# Patient Record
Sex: Male | Born: 1965 | Race: Black or African American | Hispanic: No | Marital: Single
Health system: Southern US, Community
[De-identification: ages and names within clinical notes are randomized; demographics above are authoritative.]

## PROBLEM LIST (undated history)

## (undated) ENCOUNTER — Emergency Department (HOSPITAL_COMMUNITY): Admission: EM

---

## 1998-12-21 ENCOUNTER — Emergency Department (HOSPITAL_COMMUNITY): Admission: EM | Admit: 1998-12-21 | Discharge: 1998-12-21 | Payer: Self-pay | Admitting: Emergency Medicine

## 1998-12-22 ENCOUNTER — Emergency Department (HOSPITAL_COMMUNITY): Admission: EM | Admit: 1998-12-22 | Discharge: 1998-12-22 | Payer: Self-pay | Admitting: Emergency Medicine

## 1998-12-22 ENCOUNTER — Encounter: Payer: Self-pay | Admitting: Emergency Medicine

## 2000-11-23 ENCOUNTER — Emergency Department (HOSPITAL_COMMUNITY): Admission: EM | Admit: 2000-11-23 | Discharge: 2000-11-23 | Payer: Self-pay | Admitting: Emergency Medicine

## 2001-12-07 ENCOUNTER — Inpatient Hospital Stay (HOSPITAL_COMMUNITY): Admission: EM | Admit: 2001-12-07 | Discharge: 2001-12-08 | Payer: Self-pay

## 2001-12-07 ENCOUNTER — Encounter: Payer: Self-pay | Admitting: Internal Medicine

## 2002-03-07 ENCOUNTER — Emergency Department (HOSPITAL_COMMUNITY): Admission: EM | Admit: 2002-03-07 | Discharge: 2002-03-07 | Payer: Self-pay | Admitting: Emergency Medicine

## 2015-06-17 ENCOUNTER — Emergency Department (HOSPITAL_COMMUNITY)
Admission: EM | Admit: 2015-06-17 | Discharge: 2015-06-17 | Disposition: A | Discharge status: Home / Self Care | Payer: Self-pay | Attending: Emergency Medicine | Admitting: Emergency Medicine

## 2015-06-17 ENCOUNTER — Encounter (HOSPITAL_COMMUNITY): Payer: Self-pay | Admitting: Emergency Medicine

## 2015-06-17 ENCOUNTER — Emergency Department (HOSPITAL_COMMUNITY): Payer: Self-pay

## 2015-06-17 DIAGNOSIS — M791 Myalgia: Secondary | ICD-10-CM | POA: Insufficient documentation

## 2015-06-17 DIAGNOSIS — F17200 Nicotine dependence, unspecified, uncomplicated: Secondary | ICD-10-CM | POA: Insufficient documentation

## 2015-06-17 DIAGNOSIS — R0789 Other chest pain: Secondary | ICD-10-CM | POA: Insufficient documentation

## 2015-06-17 DIAGNOSIS — R509 Fever, unspecified: Secondary | ICD-10-CM | POA: Insufficient documentation

## 2015-06-17 DIAGNOSIS — J3489 Other specified disorders of nose and nasal sinuses: Secondary | ICD-10-CM | POA: Insufficient documentation

## 2015-06-17 LAB — CBC WITH DIFFERENTIAL/PLATELET
BASOS ABS: 0 10*3/uL (ref 0.0–0.1)
BASOS PCT: 0 %
Eosinophils Absolute: 0 10*3/uL (ref 0.0–0.7)
Eosinophils Relative: 0 %
HEMATOCRIT: 41.5 % (ref 39.0–52.0)
HEMOGLOBIN: 14.2 g/dL (ref 13.0–17.0)
Lymphocytes Relative: 15 %
Lymphs Abs: 1.8 10*3/uL (ref 0.7–4.0)
MCH: 29.6 pg (ref 26.0–34.0)
MCHC: 34.2 g/dL (ref 30.0–36.0)
MCV: 86.6 fL (ref 78.0–100.0)
Monocytes Absolute: 1.1 10*3/uL — ABNORMAL HIGH (ref 0.1–1.0)
Monocytes Relative: 9 %
NEUTROS ABS: 8.8 10*3/uL — AB (ref 1.7–7.7)
NEUTROS PCT: 76 %
Platelets: 187 10*3/uL (ref 150–400)
RBC: 4.79 MIL/uL (ref 4.22–5.81)
RDW: 14.2 % (ref 11.5–15.5)
WBC: 11.7 10*3/uL — AB (ref 4.0–10.5)

## 2015-06-17 LAB — COMPREHENSIVE METABOLIC PANEL
ALK PHOS: 49 U/L (ref 38–126)
ALT: 15 U/L — ABNORMAL LOW (ref 17–63)
ANION GAP: 8 (ref 5–15)
AST: 13 U/L — ABNORMAL LOW (ref 15–41)
Albumin: 3.2 g/dL — ABNORMAL LOW (ref 3.5–5.0)
BILIRUBIN TOTAL: 0.5 mg/dL (ref 0.3–1.2)
BUN: 9 mg/dL (ref 6–20)
CALCIUM: 8.9 mg/dL (ref 8.9–10.3)
CO2: 23 mmol/L (ref 22–32)
Chloride: 102 mmol/L (ref 101–111)
Creatinine, Ser: 1.38 mg/dL — ABNORMAL HIGH (ref 0.61–1.24)
GFR calc non Af Amer: 60 mL/min — ABNORMAL LOW (ref >60)
Glucose, Bld: 98 mg/dL (ref 65–99)
Potassium: 3.7 mmol/L (ref 3.5–5.1)
SODIUM: 133 mmol/L — AB (ref 135–145)
TOTAL PROTEIN: 6.7 g/dL (ref 6.5–8.1)

## 2015-06-17 LAB — INFLUENZA PANEL BY PCR (TYPE A & B)
H1N1FLUPCR: NOT DETECTED
INFLBPCR: NEGATIVE
Influenza A By PCR: NEGATIVE

## 2015-06-17 MED ORDER — ACETAMINOPHEN 325 MG PO TABS
650.0000 mg | ORAL_TABLET | Freq: Once | ORAL | Status: AC
  Administered 2015-06-17: 650 mg via ORAL
  Filled 2015-06-17: qty 2

## 2015-06-17 MED ORDER — SODIUM CHLORIDE 0.9 % IV BOLUS (SEPSIS)
1000.0000 mL | Freq: Once | INTRAVENOUS | Status: AC
  Administered 2015-06-17: 1000 mL via INTRAVENOUS

## 2015-06-17 NOTE — ED Notes (Signed)
Pt c/o fever and body aches onset yesterday. Pt reports fever was 104.

## 2015-06-17 NOTE — Discharge Instructions (Signed)
Take Motrin or Tylenol every 6 hours for fevers. Follow up with your Primary care doctor in the next couple of days for re-evaluation. Return to the emergency department if symptoms worsen.   Fever, Adult A fever is an increase in the body's temperature. It is often defined as a temperature of 100 F (38C) or higher. Short mild or moderate fevers often have no long-term effects. They also often do not need treatment. Moderate or high fevers may make you feel uncomfortable. Sometimes, they can also be a sign of a serious illness or disease. The sweating that may happen with repeated fevers or fevers that last a while may also cause you to not have enough fluid in your body (dehydration). You can take your temperature with a thermometer to see if you have a fever. A measured temperature can change with:  Age.  Time of day.  Where the thermometer is placed:  Mouth (oral).  Rectum (rectal).  Ear (tympanic).  Underarm (axillary).  Forehead (temporal). HOME CARE Pay attention to any changes in your symptoms. Take these actions to help with your condition:  Take over-the-counter and prescription medicines only as told by your doctor. Follow the dosing instructions carefully.  If you were prescribed an antibiotic medicine, take it as told by your doctor. Do not stop taking the antibiotic even if you start to feel better.  Rest as needed.  Drink enough fluid to keep your pee (urine) clear or pale yellow.  Sponge yourself or bathe with room-temperature water as needed. This helps to lower your body temperature . Do not use ice water.  Do not wear too many blankets or heavy clothes. GET HELP IF:  You throw up (vomit).  You cannot eat or drink without throwing up.  You have watery poop (diarrhea).  It hurts when you pee.  Your symptoms do not get better with treatment.  You have new symptoms.  You feel very weak. GET HELP RIGHT AWAY IF:  You are short of breath or have  trouble breathing.  You are dizzy or you pass out (faint).  You feel confused.  You have signs of not having enough fluid in your body, such as:  A dry mouth.  Peeing less.  Looking pale.  You have very bad pain in your belly (abdomen).  You keep throwing up or having water poop.  You have a skin rash.  Your symptoms suddenly get worse.   This information is not intended to replace advice given to you by your health care provider. Make sure you discuss any questions you have with your health care provider.   Document Released: 04/01/2008 Document Revised: 03/14/2015 Document Reviewed: 08/17/2014 Elsevier Interactive Patient Education [employer]2016 Elsevier Inc.

## 2015-06-17 NOTE — ED Notes (Signed)
Family at bedside. 

## 2015-06-17 NOTE — ED Provider Notes (Signed)
CSN: [id number]     Arrival date & time 06/17/15  1734 History   First MD Initiated Contact with Patient 06/17/15 1800     Chief Complaint  Patient presents with  . Fever  . Generalized Body Aches     (Consider location/radiation/quality/duration/timing/severity/associated sxs/prior Treatment) HPI  Patient is a 49 year old male with no significant past medical history who presents to the emergency department with fever. Reports a 2 day history of fever, Tmax 100.4. Associated with headache, cough, congestion or shortness of breath, nausea, myalgias, diarrhea. Denies neck pain or stiffness. No rashes. Denies abdominal pain, bloody stool, dysuria, urinary urgency or frequency. Denies taking any medication.   History reviewed. No pertinent past medical history. History reviewed. No pertinent past surgical history. No family history on file. Social History  Substance Use Topics  . Smoking status: Current Every Day Smoker  . Smokeless tobacco: None  . Alcohol Use: Yes    Review of Systems  Constitutional: Negative for fever and appetite change.  HENT: Positive for rhinorrhea. Negative for congestion.   Eyes: Negative for visual disturbance.  Respiratory: Positive for cough and chest tightness. Negative for shortness of breath.   Cardiovascular: Negative for chest pain and palpitations.  Gastrointestinal: Negative for nausea, vomiting, abdominal pain and blood in stool.  Genitourinary: Negative for dysuria, frequency, hematuria and decreased urine volume.  Musculoskeletal: Positive for myalgias. Negative for back pain, neck pain and neck stiffness.  Skin: Negative for rash.  Neurological: Negative for dizziness, seizures, weakness, light-headedness and headaches.  Psychiatric/Behavioral: Negative for behavioral problems.      Allergies  Review of patient's allergies indicates no known allergies.  Home Medications   Prior to Admission medications   Not on File   BP 110/64  mmHg  Pulse 102  Temp(Src) 100.4 F (38 C) (Oral)  Resp 24  Ht 5\' 6"  (1.676 m)  Wt 90.719 kg  BMI 32.30 kg/m2  SpO2 95% Physical Exam  Constitutional: He is oriented to person, place, and time. He appears well-developed and well-nourished. He appears ill. No distress.  HENT:  Head: Normocephalic and atraumatic.  Mouth/Throat: Oropharynx is clear and moist.  Eyes: Conjunctivae and EOM are normal. Pupils are equal, round, and reactive to light.  Neck: Normal range of motion. Neck supple. No JVD present. No tracheal deviation present. No Brudzinski's sign and no Kernig's sign noted.  Cardiovascular: Regular rhythm, normal heart sounds and intact distal pulses.   Pulmonary/Chest: Effort normal and breath sounds normal. No respiratory distress. He has no wheezes. He exhibits no tenderness.  Abdominal: Soft. He exhibits no distension. There is no tenderness. There is no rebound and no guarding.  Musculoskeletal: Normal range of motion.  Neurological: He is alert and oriented to person, place, and time.  Skin: Skin is warm.  Psychiatric: He has a normal mood and affect.  Nursing note and vitals reviewed.   ED Course  Procedures (including critical care time) Labs Review Labs Reviewed  COMPREHENSIVE METABOLIC PANEL - Abnormal; Notable for the following:    Sodium 133 (*)    Creatinine, Ser 1.38 (*)    Albumin 3.2 (*)    AST 13 (*)    ALT 15 (*)    GFR calc non Af Amer 60 (*)    All other components within normal limits  CBC WITH DIFFERENTIAL/PLATELET - Abnormal; Notable for the following:    WBC 11.7 (*)    Neutro Abs 8.8 (*)    Monocytes Absolute 1.1 (*)  All other components within normal limits  INFLUENZA PANEL BY PCR (TYPE A & B, H1N1)    Imaging Review Dg Chest Port 1 View  06/17/2015  CLINICAL DATA:  Cough, fever, and weakness for 2 days.  Smoker. EXAM: PORTABLE CHEST 1 VIEW COMPARISON:  None. FINDINGS: The heart size and mediastinal contours are within normal limits.  Both lungs are clear. The visualized skeletal structures are unremarkable. IMPRESSION: No active disease. Electronically Signed   By: Burman Nieves M.D.   On: 06/17/2015 19:17   I have personally reviewed and evaluated these images and lab results as part of my medical decision-making.   EKG Interpretation None      MDM   Final diagnoses:  Fever and chills    Patient is a 49 year old male with no significant past medical history who presents to the emergency department with a fever. Temp 100.4 on arrival. Hemodynamically stable. Exam as above, notable for lungs clear to auscultation bilaterally, benign abdominal exam, intact distal pulses.  Patient most likely with a viral illness. Found to have leukocytosis 11.7. No significant electrolyte abnormalities. Chest x-ray showed no signs of pneumonia. Influenza test negative. Patient given IV fluids and Tylenol. Do not feel the patient needs further imaging at this time. No signs of serious bacterial source. No signs of meningismus. Reevaluation patient reports that he felt significantly better. Patient stable for discharge home. Discussed follow-up with primary care physician in the next 2-3 days. Discussed strict return precautions to the emergency department. Patient expressed understanding, no questions or concerns at time of discharge.    Corena Herter, MD 06/18/15 1442  Benjiman Core, MD 06/20/15 1640

## 2015-06-17 NOTE — ED Notes (Signed)
Patient is alert and orientedx4.  Patient was explained discharge instructions and they understood them with no questions.   

## 2017-06-09 IMAGING — CR DG CHEST 1V PORT
1 series · 1 of 1 positions shown · non-contrast
Comparison: None.

CLINICAL DATA: Cough, fever, and weakness for 2 days.  Smoker.

EXAM:
PORTABLE CHEST 1 VIEW

[AP]
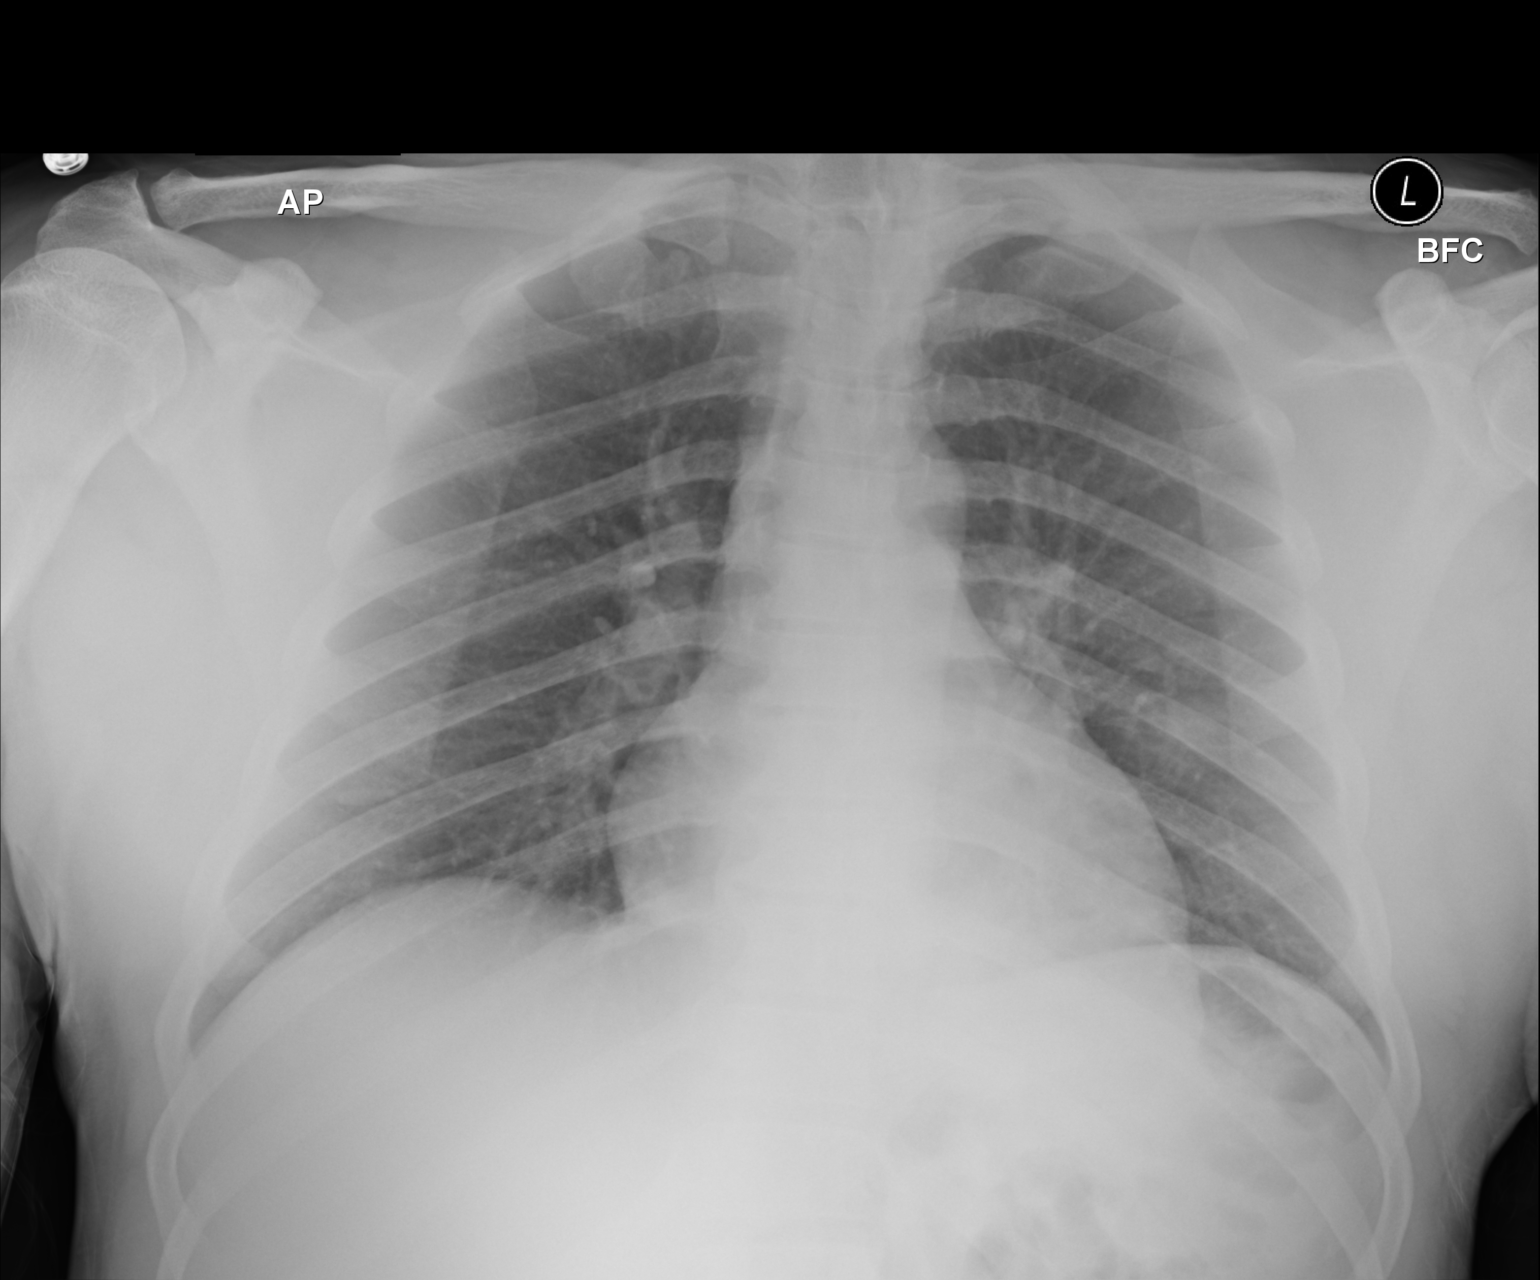

[1 of 1 positions shown; findings below may reference images not displayed]

FINDINGS: The heart size and mediastinal contours are within normal limits.
Both lungs are clear. The visualized skeletal structures are
unremarkable.
IMPRESSION: No active disease.

## 2019-12-15 ENCOUNTER — Other Ambulatory Visit: Payer: Self-pay

## 2019-12-15 ENCOUNTER — Emergency Department (HOSPITAL_COMMUNITY)
Admission: EM | Admit: 2019-12-15 | Discharge: 2019-12-16 | Disposition: A | Discharge status: Home / Self Care | Payer: No Typology Code available for payment source | Attending: Emergency Medicine | Admitting: Emergency Medicine

## 2019-12-15 DIAGNOSIS — Y929 Unspecified place or not applicable: Secondary | ICD-10-CM | POA: Diagnosis not present

## 2019-12-15 DIAGNOSIS — Y999 Unspecified external cause status: Secondary | ICD-10-CM | POA: Insufficient documentation

## 2019-12-15 DIAGNOSIS — Y93G1 Activity, food preparation and clean up: Secondary | ICD-10-CM | POA: Diagnosis not present

## 2019-12-15 DIAGNOSIS — F17210 Nicotine dependence, cigarettes, uncomplicated: Secondary | ICD-10-CM | POA: Diagnosis not present

## 2019-12-15 DIAGNOSIS — W268XXA Contact with other sharp object(s), not elsewhere classified, initial encounter: Secondary | ICD-10-CM | POA: Insufficient documentation

## 2019-12-15 DIAGNOSIS — S61411A Laceration without foreign body of right hand, initial encounter: Secondary | ICD-10-CM | POA: Diagnosis present

## 2019-12-16 ENCOUNTER — Emergency Department (HOSPITAL_COMMUNITY): Payer: No Typology Code available for payment source

## 2019-12-16 ENCOUNTER — Other Ambulatory Visit: Payer: Self-pay

## 2019-12-16 MED ORDER — BACITRACIN ZINC 500 UNIT/GM EX OINT
TOPICAL_OINTMENT | Freq: Once | CUTANEOUS | Status: AC
  Administered 2019-12-16: 1 via TOPICAL

## 2019-12-16 MED ORDER — TETANUS-DIPHTH-ACELL PERTUSSIS 5-2.5-18.5 LF-MCG/0.5 IM SUSP
0.5000 mL | Freq: Once | INTRAMUSCULAR | Status: AC
  Administered 2019-12-16: 0.5 mL via INTRAMUSCULAR
  Filled 2019-12-16: qty 0.5

## 2019-12-16 MED ORDER — LIDOCAINE HCL (PF) 1 % IJ SOLN
5.0000 mL | Freq: Once | INTRAMUSCULAR | Status: AC
  Administered 2019-12-16: 5 mL
  Filled 2019-12-16: qty 5

## 2019-12-16 MED ORDER — CEPHALEXIN 500 MG PO CAPS: 500.0000 mg | ORAL_CAPSULE | Freq: Three times a day (TID) | ORAL | 0 refills | Status: AC

## 2019-12-16 MED ORDER — ACETAMINOPHEN 325 MG PO TABS
650.0000 mg | ORAL_TABLET | Freq: Once | ORAL | Status: AC
  Administered 2019-12-16: 650 mg via ORAL
  Filled 2019-12-16: qty 2

## 2019-12-16 NOTE — ED Notes (Addendum)
Pt left before signing the pad however he received d/c paperwork and understood the instructions.

## 2019-12-16 NOTE — ED Notes (Signed)
Pt hand soaking in betadine and sterile water 

## 2019-12-16 NOTE — ED Provider Notes (Signed)
[facility] EMERGENCY DEPARTMENT Provider Note   CSN: [id number] Arrival date & time: 12/15/19  2352     History Chief Complaint  Patient presents with  . Extremity Laceration    hand    Jason Coleman is a 54 y.o. male.  54 year old right-hand-dominant male presents with laceration to the palm of the right hand.  Patient states that he was opening a can of meat he accidentally sliced the palm of his hand yesterday.  Patient is not anticoagulated, last tetanus unknown.  Denies weakness or numbness in the hand.  Bleeding is controlled.  No other complaints or concerns today.        No past medical history on file.  There are no problems to display for this patient.   No past surgical history on file.     No family history on file.  Social History   Tobacco Use  . Smoking status: Current Every Day Smoker  Substance Use Topics  . Alcohol use: Yes  . Drug use: Yes    Types: Marijuana    Home Medications Prior to Admission medications   Medication Sig Start Date End Date Taking? Authorizing Provider  cephALEXin (KEFLEX) 500 MG capsule Take 1 capsule (500 mg total) by mouth 3 (three) times daily for 5 days. 12/16/19 12/21/19  Jeannie Fend, PA-C    Allergies    Patient has no known allergies.  Review of Systems   Review of Systems  Constitutional: Negative for fever.  Musculoskeletal: Positive for myalgias.  Skin: Positive for wound.  Allergic/Immunologic: Negative for immunocompromised state.  Neurological: Negative for weakness and numbness.  Hematological: Does not bruise/bleed easily.    Physical Exam Updated Vital Signs BP (!) 162/91   Pulse (!) 107   Temp 98.3 F (36.8 C) (Oral)   Resp 18   SpO2 100%   Physical Exam Vitals and nursing note reviewed.  Constitutional:      General: He is not in acute distress.    Appearance: He is well-developed. He is not diaphoretic.  HENT:     Head: Normocephalic and atraumatic.    Cardiovascular:     Pulses: Normal pulses.  Pulmonary:     Effort: Pulmonary effort is normal.  Musculoskeletal:        General: Tenderness present. No swelling. Normal range of motion.       Arms:  Skin:    General: Skin is warm and dry.     Coloration: Skin is not pale.     Findings: No erythema or rash.  Neurological:     Mental Status: He is alert and oriented to person, place, and time.     Sensory: No sensory deficit.  Psychiatric:        Behavior: Behavior normal.     ED Results / Procedures / Treatments   Labs (all labs ordered are listed, but only abnormal results are displayed) Labs Reviewed - No data to display  EKG None  Radiology DG Hand Complete Right  Result Date: 12/16/2019 CLINICAL DATA:  Cut hand on a can, injury to the palmar soft tissues near the first web space. EXAM: RIGHT HAND - COMPLETE 3+ VIEW COMPARISON:  None. FINDINGS: Soft tissue irregularity and swelling along the palmar soft tissues adjacent the first web space without soft tissue gas or foreign body. Overlying bandaging material is present. No acute bony abnormality. Specifically, no fracture, subluxation, or dislocation. There is remote deformity of the fifth proximal phalanx and diaphysis of  the fifth metacarpal. IMPRESSION: 1. Soft tissue injury of the first web space, correlate with site of laceration. No underlying osseous abnormality. No soft tissue gas or foreign body. 2. Remote deformities of the fifth proximal phalanx and fifth metacarpal. Electronically Signed   By: Kreg Shropshire M.D.   On: 12/16/2019 01:24    Procedures .Marland KitchenLaceration Repair  Date/Time: 12/16/2019 7:46 AM Performed by: Jeannie Fend, PA-C Authorized by: Jeannie Fend, PA-C   Consent:    Consent obtained:  Verbal   Consent given by:  Patient   Risks discussed:  Infection, need for additional repair, pain, poor cosmetic result and poor wound healing   Alternatives discussed:  No treatment and delayed  treatment Universal protocol:    Procedure explained and questions answered to patient or proxy's satisfaction: yes     Relevant documents present and verified: yes     Test results available and properly labeled: yes     Imaging studies available: yes     Required blood products, implants, devices, and special equipment available: yes     Site/side marked: yes     Immediately prior to procedure, a time out was called: yes     Patient identity confirmed:  Verbally with patient Anesthesia (see MAR for exact dosages):    Anesthesia method:  Local infiltration   Local anesthetic:  Lidocaine 1% w/o epi Laceration details:    Location:  Hand   Hand location:  R palm   Length (cm):  4   Depth (mm):  3 Repair type:    Repair type:  Simple Pre-procedure details:    Preparation:  Patient was prepped and draped in usual sterile fashion and imaging obtained to evaluate for foreign bodies Exploration:    Hemostasis achieved with:  Direct pressure   Wound exploration: wound explored through full range of motion and entire depth of wound probed and visualized     Wound extent: no foreign bodies/material noted, no muscle damage noted, no nerve damage noted, no tendon damage noted, no underlying fracture noted and no vascular damage noted     Contaminated: no   Treatment:    Area cleansed with:  Saline   Amount of cleaning:  Extensive   Irrigation solution:  Sterile saline Skin repair:    Repair method:  Sutures   Suture size:  4-0   Suture material:  Nylon   Suture technique:  Simple interrupted   Number of sutures:  8 Approximation:    Approximation:  Close Post-procedure details:    Dressing:  Antibiotic ointment and bulky dressing   Patient tolerance of procedure:  Tolerated well, no immediate complications   (including critical care time)  Medications Ordered in ED Medications  Tdap (BOOSTRIX) injection 0.5 mL (0.5 mLs Intramuscular Given 12/16/19 0702)  acetaminophen (TYLENOL)  tablet 650 mg (650 mg Oral Given 12/16/19 0702)  lidocaine (PF) (XYLOCAINE) 1 % injection 5 mL (5 mLs Infiltration Given 12/16/19 0729)  bacitracin ointment (1 application Topical Given 12/16/19 0757)    ED Course  I have reviewed the triage vital signs and the nursing notes.  Pertinent labs & imaging results that were available during my care of the patient were reviewed by me and considered in my medical decision making (see chart for details).  Clinical Course as of Dec 16 850  Fri Dec 16, 2019  0849 53yo male with laceration to the palm of the right hand. Laceration occurred yesterday evening, patient came to the ER after several hours  of not being able to stop the bleeding at home. Prolonged wait in the ER today due to high volume, wound was soaked, cleaned, irrigated, and closed with sutures, will place on antibiotics due to length of time to closure. Recommend wound check in 2 days, suture removal in 7-10 days. Sensation intact, ROM normal against resistance. XR negative for FB.   [LM]    Clinical Course User Index [LM] Roque Lias   MDM Rules/Calculators/A&P                          Final Clinical Impression(s) / ED Diagnoses Final diagnoses:  Laceration of right hand without foreign body, initial encounter    Rx / DC Orders ED Discharge Orders         Ordered    cephALEXin (KEFLEX) 500 MG capsule  3 times daily     Discontinue  Reprint     12/16/19 0747           Tacy Learn, PA-C 12/16/19 8295    Dorie Rank, MD 12/17/19 [phone]

## 2019-12-16 NOTE — ED Notes (Signed)
ED provider at bedside for repair 

## 2019-12-16 NOTE — ED Triage Notes (Signed)
Pt was at home and was opening a can and went to pull the lid and sliced his right hand in the palm area. Pt had bleeding controlled. There is a deep laceration in the palm.

## 2021-12-08 IMAGING — DX DG HAND COMPLETE 3+V*R*
3 series · 3 of 3 positions shown · non-contrast
Comparison: None.

CLINICAL DATA: Cut hand on a can, injury to the palmar soft tissues
near the first web space.

EXAM:
RIGHT HAND - COMPLETE 3+ VIEW

[hand pa]
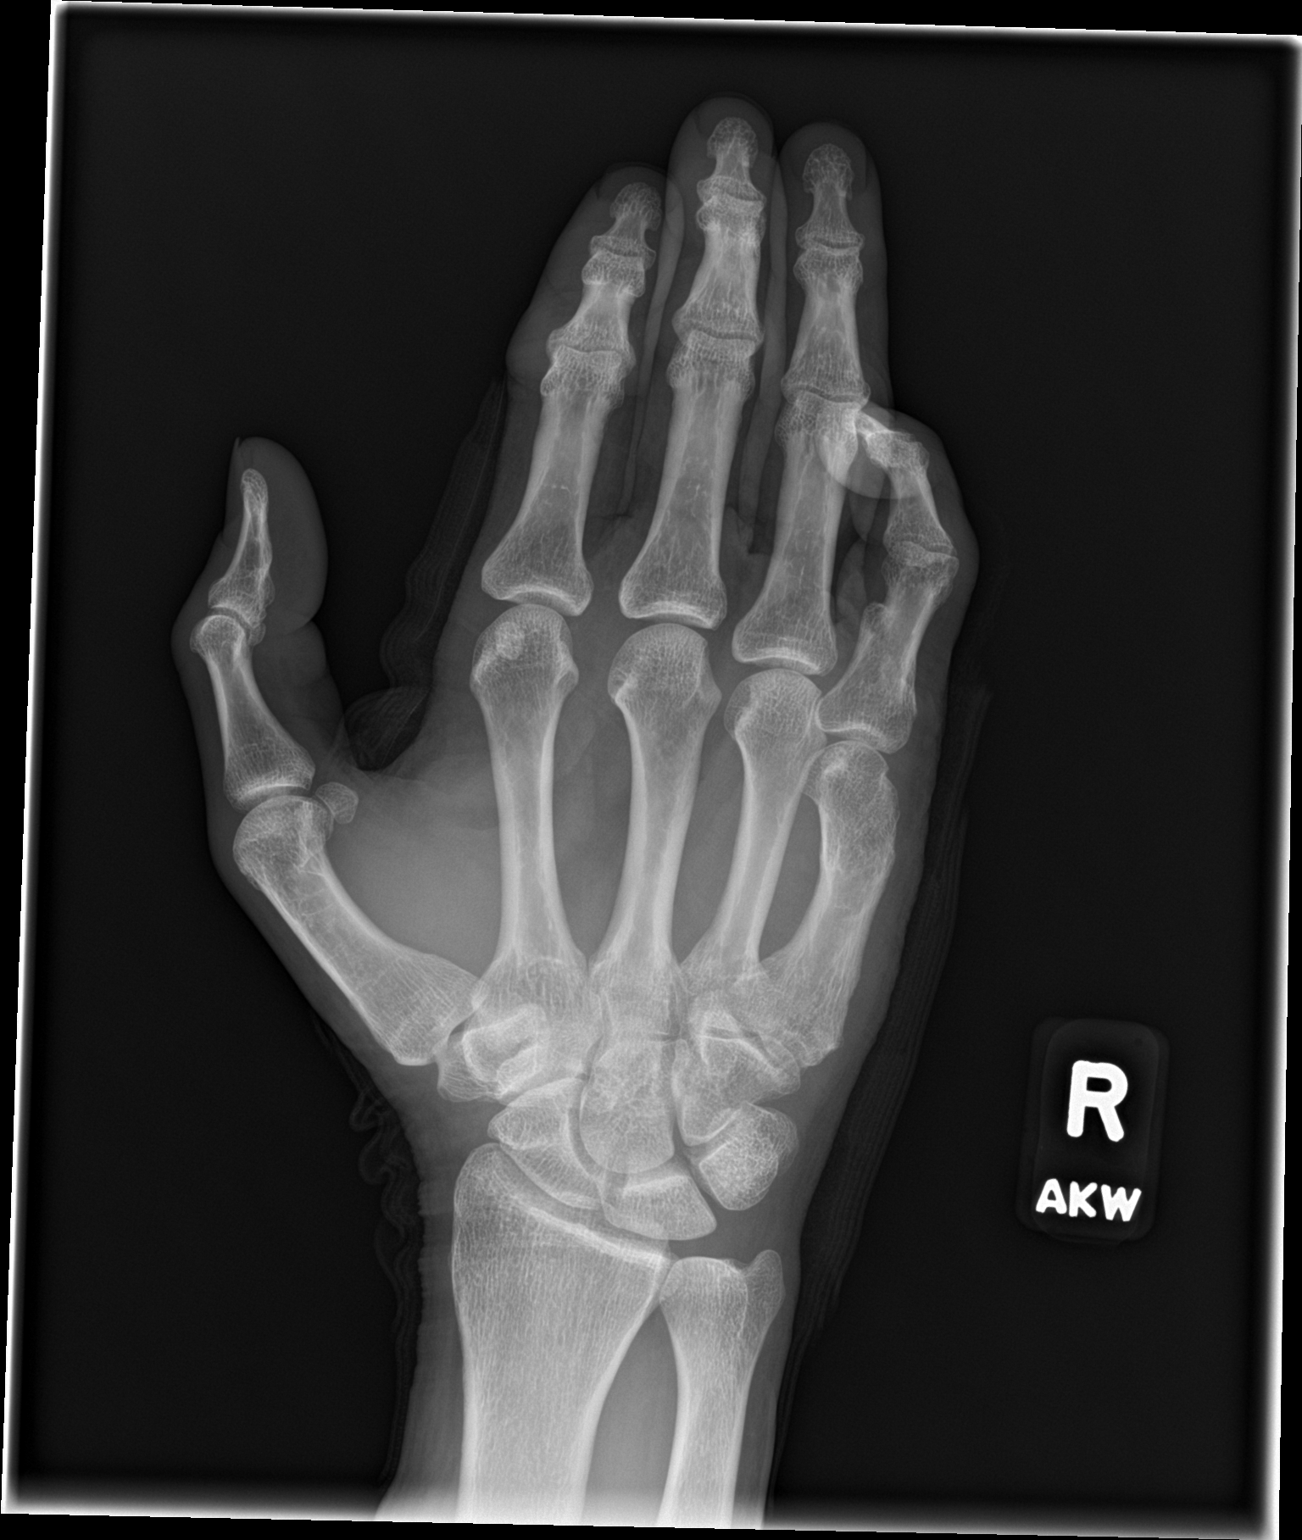

[hand lat]
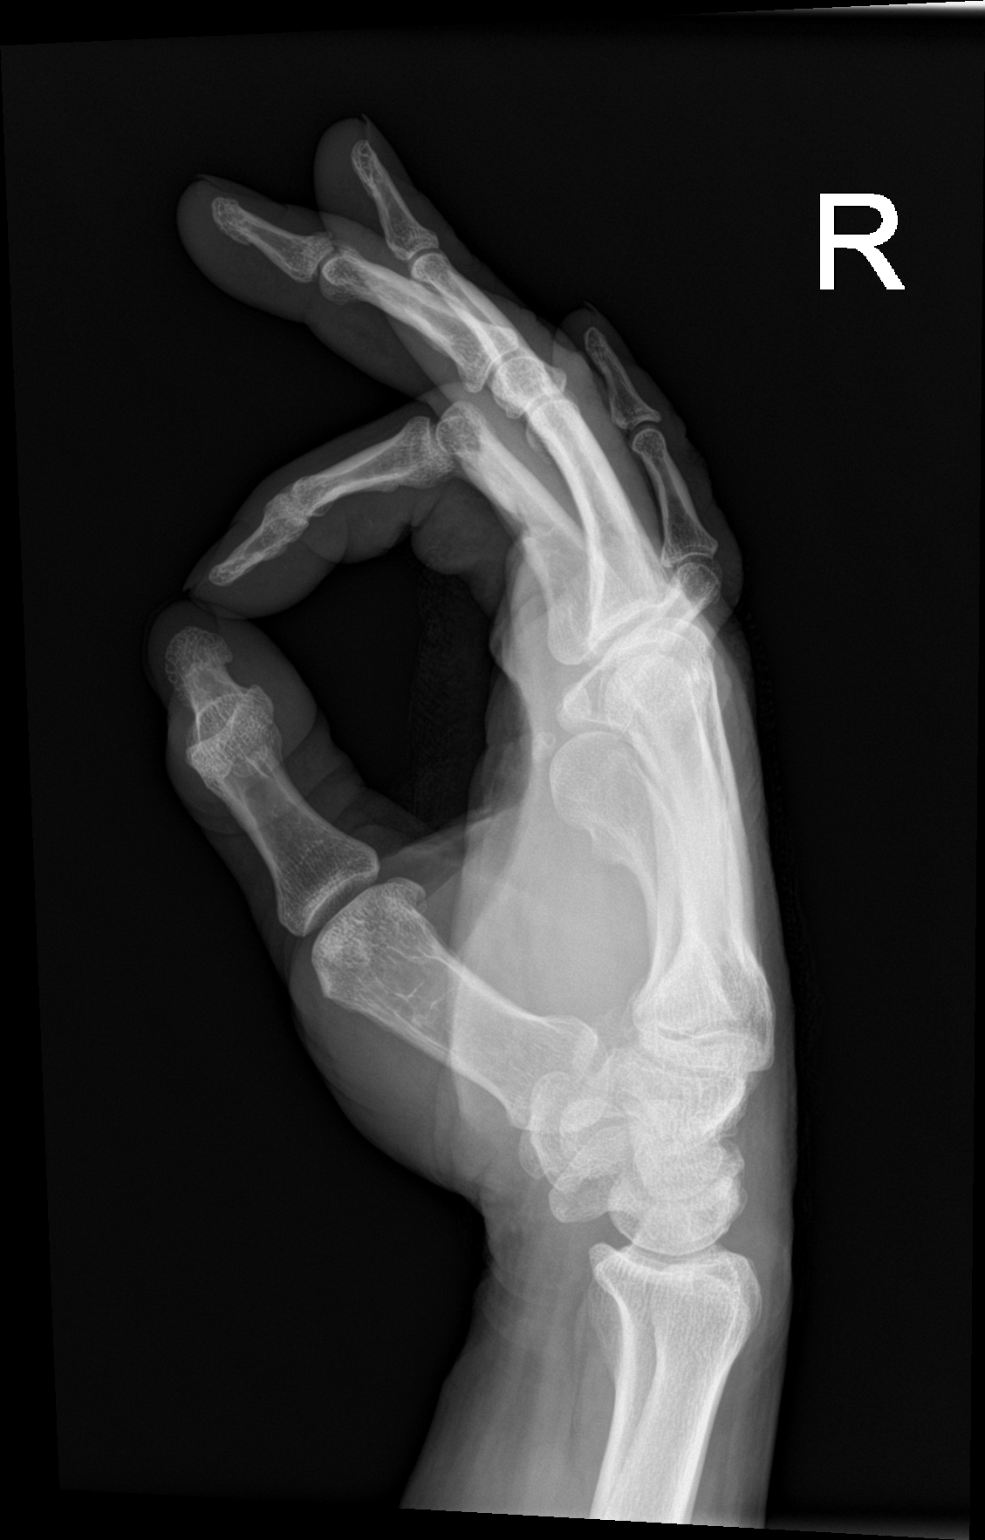

[hand obl]
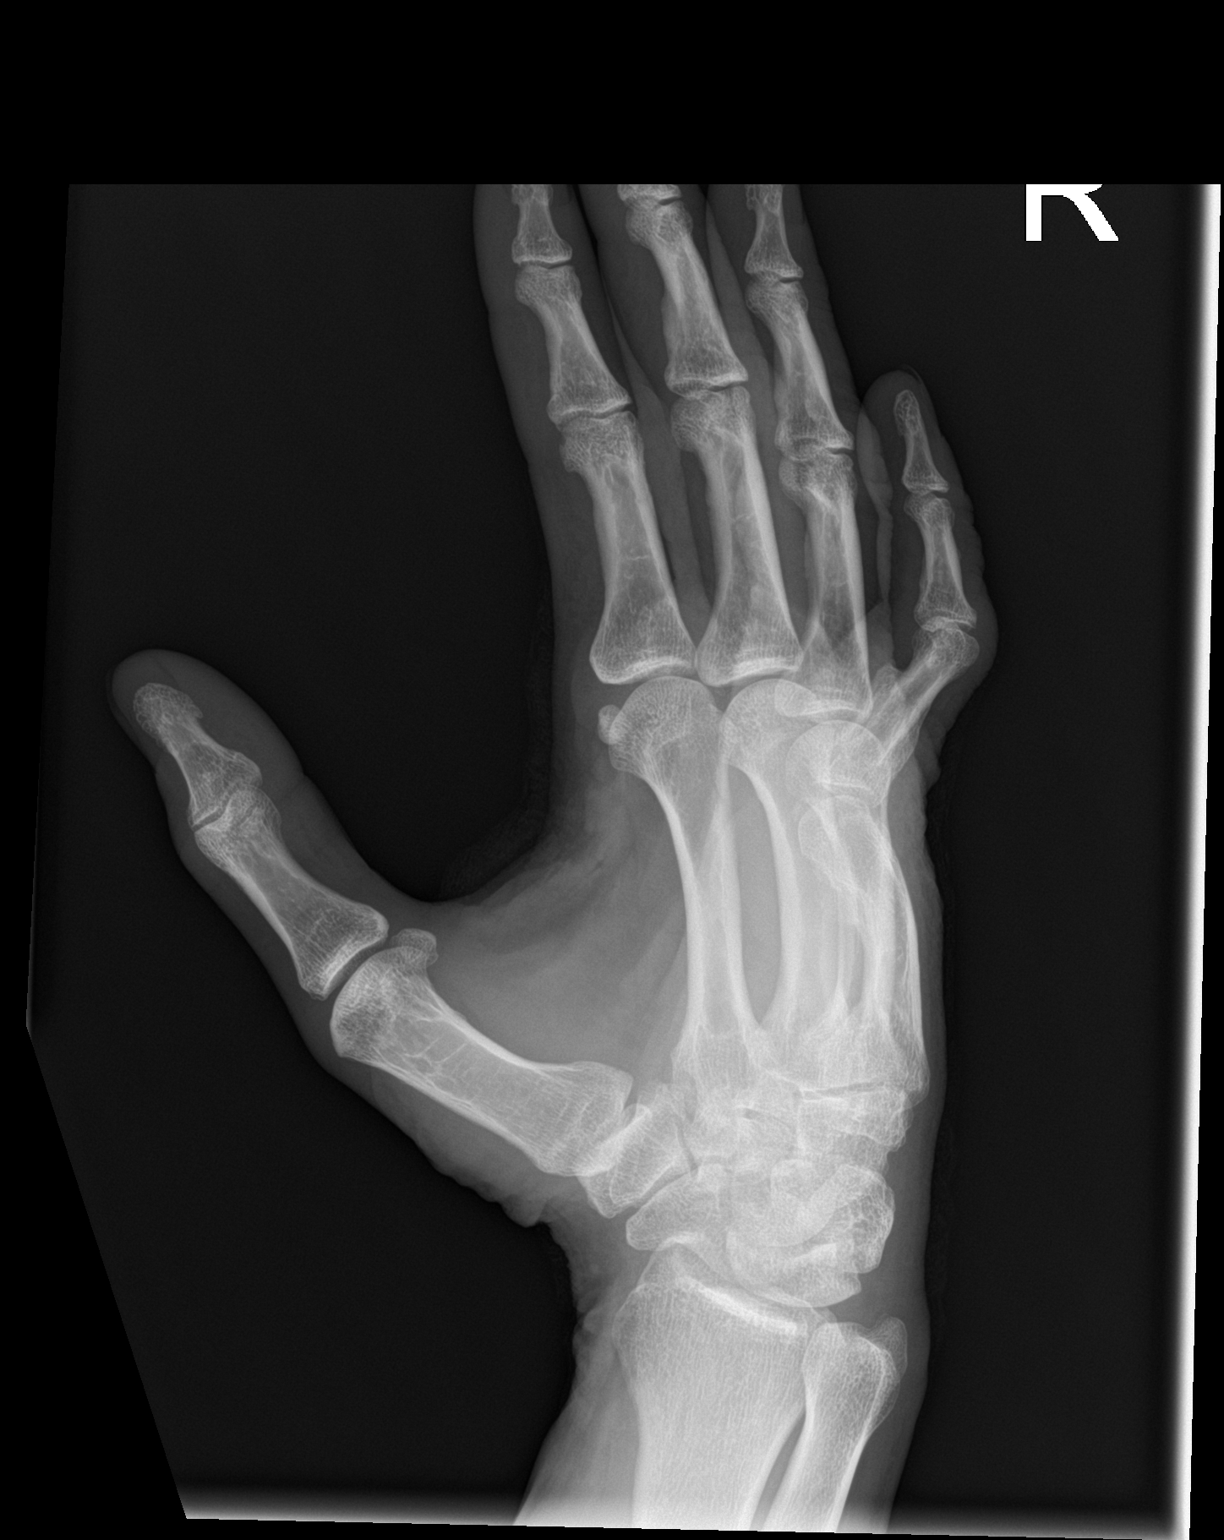

[3 of 3 positions shown; findings below may reference images not displayed]

FINDINGS: Soft tissue irregularity and swelling along the palmar soft tissues
adjacent the first web space without soft tissue gas or foreign
body. Overlying bandaging material is present. No acute bony
abnormality. Specifically, no fracture, subluxation, or dislocation.
There is remote deformity of the fifth proximal phalanx and
diaphysis of the fifth metacarpal.
IMPRESSION: 1. Soft tissue injury of the first web space, correlate with site of
laceration. No underlying osseous abnormality. No soft tissue gas or
foreign body.
2. Remote deformities of the fifth proximal phalanx and fifth
metacarpal.

## 2022-05-11 ENCOUNTER — Emergency Department (HOSPITAL_BASED_OUTPATIENT_CLINIC_OR_DEPARTMENT_OTHER)
Admission: EM | Admit: 2022-05-11 | Discharge: 2022-05-11 | Disposition: A | Discharge status: Home / Self Care | Payer: No Typology Code available for payment source | Attending: Emergency Medicine | Admitting: Emergency Medicine

## 2022-05-11 ENCOUNTER — Encounter (HOSPITAL_BASED_OUTPATIENT_CLINIC_OR_DEPARTMENT_OTHER): Payer: Self-pay | Admitting: Emergency Medicine

## 2022-05-11 DIAGNOSIS — R220 Localized swelling, mass and lump, head: Secondary | ICD-10-CM | POA: Diagnosis not present

## 2022-05-11 DIAGNOSIS — K0889 Other specified disorders of teeth and supporting structures: Secondary | ICD-10-CM | POA: Insufficient documentation

## 2022-05-11 MED ORDER — NAPROXEN 500 MG PO TABS: 500.0000 mg | ORAL_TABLET | Freq: Two times a day (BID) | ORAL | 0 refills | Status: DC

## 2022-05-11 MED ORDER — CLINDAMYCIN HCL 150 MG PO CAPS: 150.0000 mg | ORAL_CAPSULE | Freq: Four times a day (QID) | ORAL | 0 refills | Status: DC

## 2022-05-11 NOTE — ED Triage Notes (Signed)
Pain to R lower gum with facial swelling since yesterday.

## 2022-05-11 NOTE — ED Provider Notes (Signed)
[facility] EMERGENCY DEPT Provider Note   CSN: [id number] Arrival date & time: 05/11/22  3295     History  Chief Complaint  Patient presents with   Dental Pain    Jason Coleman is a 56 y.o. male.  Patient presents with right facial swelling and dental pain for the past week.  Patient has history of dental cavities.  No history of significant dental procedure.  Pain started this morning.  No injuries.  No history of bone infections.  Patient denies diabetes or active medical problems.  Pain constant.       Home Medications Prior to Admission medications   Medication Sig Start Date End Date Taking? Authorizing Provider  naproxen (NAPROSYN) 500 MG tablet Take 1 tablet (500 mg total) by mouth 2 (two) times daily. 05/11/22  Yes Blane Ohara, MD      Allergies    Patient has no known allergies.    Review of Systems   Review of Systems  Constitutional:  Negative for chills and fever.  HENT:  Positive for dental problem. Negative for congestion.   Eyes:  Negative for visual disturbance.  Respiratory:  Negative for shortness of breath.   Cardiovascular:  Negative for chest pain.  Gastrointestinal:  Negative for abdominal pain and vomiting.  Genitourinary:  Negative for dysuria and flank pain.  Musculoskeletal:  Negative for back pain, neck pain and neck stiffness.  Skin:  Negative for rash.  Neurological:  Negative for light-headedness and headaches.    Physical Exam Updated Vital Signs BP (!) 142/101 (BP Location: Left Arm)   Pulse 96   Temp 98.5 F (36.9 C) (Oral)   Resp 20   Ht 5\' 6"  (1.676 m)   Wt 77.1 kg   SpO2 99%   BMI 27.44 kg/m  Physical Exam Vitals and nursing note reviewed.  Constitutional:      General: He is not in acute distress.    Appearance: He is well-developed.  HENT:     Head: Normocephalic and atraumatic.     Comments: Patient has mild right lower dental swelling without fluctuance, mild anterior cervical lymphadenopathy  submandibular.  No swelling submandibular or tongue swelling or angioedema.  Mild tenderness right lower gingiva without abscess appreciated, poor dentition and fractured premolar tooth on the right lower.  No trismus.    Mouth/Throat:     Mouth: Mucous membranes are moist.  Eyes:     General:        Right eye: No discharge.        Left eye: No discharge.     Conjunctiva/sclera: Conjunctivae normal.  Neck:     Trachea: No tracheal deviation.  Cardiovascular:     Rate and Rhythm: Normal rate.  Pulmonary:     Effort: Pulmonary effort is normal.  Abdominal:     General: There is no distension.     Palpations: Abdomen is soft.     Tenderness: There is no abdominal tenderness. There is no guarding.  Musculoskeletal:     Cervical back: Normal range of motion and neck supple. No rigidity.  Skin:    General: Skin is warm.     Capillary Refill: Capillary refill takes less than 2 seconds.     Findings: No rash.  Neurological:     General: No focal deficit present.     Mental Status: He is alert.     Cranial Nerves: No cranial nerve deficit.  Psychiatric:        Mood and Affect: Mood  normal.     ED Results / Procedures / Treatments   Labs (all labs ordered are listed, but only abnormal results are displayed) Labs Reviewed - No data to display  EKG None  Radiology No results found.  Procedures Procedures    Medications Ordered in ED Medications - No data to display  ED Course/ Medical Decision Making/ A&P                           Medical Decision Making Risk Prescription drug management.   Patient presents with right dental pain and facial swelling concern for mild cellulitis versus apical abscess versus other dental pain/early infection.  No signs of significant deep space infection.  No procedure required at this time.  No indication for CT scan or blood work.  No signs of significant dehydration.  Discussed plan for oral antibiotics, pain meds as needed and  follow-up with a dentist patient comfortable this plan.        Final Clinical Impression(s) / ED Diagnoses Final diagnoses:  Pain, dental  Facial swelling    Rx / DC Orders ED Discharge Orders          Ordered    naproxen (NAPROSYN) 500 MG tablet  2 times daily        05/11/22 1231              Elnora Morrison, MD 05/11/22 1234

## 2022-05-11 NOTE — Discharge Instructions (Signed)
Take antibiotics as directed and follow-up with a dentist later this week. Use Tylenol every 4 hours as needed for pain and ice packs as needed. For severe pain you can take naproxen twice a day.

## 2023-10-03 ENCOUNTER — Emergency Department (HOSPITAL_COMMUNITY)

## 2023-10-03 ENCOUNTER — Emergency Department (HOSPITAL_COMMUNITY)
Admission: EM | Admit: 2023-10-03 | Discharge: 2023-10-04 | Disposition: A | Discharge status: Home / Self Care | Attending: Emergency Medicine | Admitting: Emergency Medicine

## 2023-10-03 ENCOUNTER — Encounter (HOSPITAL_COMMUNITY): Payer: Self-pay

## 2023-10-03 DIAGNOSIS — S0993XA Unspecified injury of face, initial encounter: Secondary | ICD-10-CM | POA: Diagnosis present

## 2023-10-03 DIAGNOSIS — S022XXA Fracture of nasal bones, initial encounter for closed fracture: Secondary | ICD-10-CM | POA: Insufficient documentation

## 2023-10-03 DIAGNOSIS — S020XXA Fracture of vault of skull, initial encounter for closed fracture: Secondary | ICD-10-CM | POA: Insufficient documentation

## 2023-10-03 DIAGNOSIS — Y92410 Unspecified street and highway as the place of occurrence of the external cause: Secondary | ICD-10-CM | POA: Diagnosis not present

## 2023-10-03 DIAGNOSIS — T07XXXA Unspecified multiple injuries, initial encounter: Secondary | ICD-10-CM

## 2023-10-03 DIAGNOSIS — D649 Anemia, unspecified: Secondary | ICD-10-CM | POA: Insufficient documentation

## 2023-10-03 DIAGNOSIS — S0083XA Contusion of other part of head, initial encounter: Secondary | ICD-10-CM | POA: Diagnosis not present

## 2023-10-03 DIAGNOSIS — S0292XA Unspecified fracture of facial bones, initial encounter for closed fracture: Secondary | ICD-10-CM

## 2023-10-03 DIAGNOSIS — Z23 Encounter for immunization: Secondary | ICD-10-CM | POA: Insufficient documentation

## 2023-10-03 LAB — CBC WITH DIFFERENTIAL/PLATELET
Abs Immature Granulocytes: 0.01 10*3/uL (ref 0.00–0.07)
Basophils Absolute: 0 10*3/uL (ref 0.0–0.1)
Basophils Relative: 1 %
Eosinophils Absolute: 0.1 10*3/uL (ref 0.0–0.5)
Eosinophils Relative: 2 %
HCT: 38 % — ABNORMAL LOW (ref 39.0–52.0)
Hemoglobin: 12.6 g/dL — ABNORMAL LOW (ref 13.0–17.0)
Immature Granulocytes: 0 %
Lymphocytes Relative: 55 %
Lymphs Abs: 3.1 10*3/uL (ref 0.7–4.0)
MCH: 30 pg (ref 26.0–34.0)
MCHC: 33.2 g/dL (ref 30.0–36.0)
MCV: 90.5 fL (ref 80.0–100.0)
Monocytes Absolute: 0.5 10*3/uL (ref 0.1–1.0)
Monocytes Relative: 9 %
Neutro Abs: 1.8 10*3/uL (ref 1.7–7.7)
Neutrophils Relative %: 33 %
Platelets: 277 10*3/uL (ref 150–400)
RBC: 4.2 MIL/uL — ABNORMAL LOW (ref 4.22–5.81)
RDW: 14.2 % (ref 11.5–15.5)
WBC: 5.6 10*3/uL (ref 4.0–10.5)
nRBC: 0 % (ref 0.0–0.2)

## 2023-10-03 LAB — COMPREHENSIVE METABOLIC PANEL WITH GFR
ALT: 22 U/L (ref 0–44)
AST: 35 U/L (ref 15–41)
Albumin: 3.8 g/dL (ref 3.5–5.0)
Alkaline Phosphatase: 78 U/L (ref 38–126)
Anion gap: 14 (ref 5–15)
BUN: 12 mg/dL (ref 6–20)
CO2: 24 mmol/L (ref 22–32)
Calcium: 8.8 mg/dL — ABNORMAL LOW (ref 8.9–10.3)
Chloride: 104 mmol/L (ref 98–111)
Creatinine, Ser: 1.11 mg/dL (ref 0.61–1.24)
GFR, Estimated: >60 mL/min (ref >60)
Glucose, Bld: 87 mg/dL (ref 70–99)
Potassium: 4.2 mmol/L (ref 3.5–5.1)
Sodium: 142 mmol/L (ref 135–145)
Total Bilirubin: 0.2 mg/dL (ref 0.0–1.2)
Total Protein: 7.1 g/dL (ref 6.5–8.1)

## 2023-10-03 LAB — I-STAT CHEM 8, ED
BUN: 13 mg/dL (ref 6–20)
Calcium, Ion: 1 mmol/L — ABNORMAL LOW (ref 1.15–1.40)
Chloride: 108 mmol/L (ref 98–111)
Creatinine, Ser: 1.3 mg/dL — ABNORMAL HIGH (ref 0.61–1.24)
Glucose, Bld: 82 mg/dL (ref 70–99)
HCT: 39 % (ref 39.0–52.0)
Hemoglobin: 13.3 g/dL (ref 13.0–17.0)
Potassium: 4.2 mmol/L (ref 3.5–5.1)
Sodium: 142 mmol/L (ref 135–145)
TCO2: 25 mmol/L (ref 22–32)

## 2023-10-03 LAB — I-STAT CG4 LACTIC ACID, ED: Lactic Acid, Venous: 1.9 mmol/L (ref 0.5–1.9)

## 2023-10-03 MED ORDER — CEFAZOLIN SODIUM-DEXTROSE 1-4 GM/50ML-% IV SOLN
1.0000 g | Freq: Once | INTRAVENOUS | Status: AC
  Administered 2023-10-03: 1 g via INTRAVENOUS
  Filled 2023-10-03: qty 50

## 2023-10-03 MED ORDER — FENTANYL CITRATE PF 50 MCG/ML IJ SOSY
50.0000 ug | PREFILLED_SYRINGE | Freq: Once | INTRAMUSCULAR | Status: AC
  Administered 2023-10-03: 50 ug via INTRAVENOUS
  Filled 2023-10-03: qty 1

## 2023-10-03 MED ORDER — TETANUS-DIPHTH-ACELL PERTUSSIS 5-2.5-18.5 LF-MCG/0.5 IM SUSY
0.5000 mL | PREFILLED_SYRINGE | Freq: Once | INTRAMUSCULAR | Status: AC
  Administered 2023-10-03: 0.5 mL via INTRAMUSCULAR
  Filled 2023-10-03: qty 0.5

## 2023-10-03 MED ORDER — IOHEXOL 350 MG/ML SOLN
75.0000 mL | Freq: Once | INTRAVENOUS | Status: AC | PRN
  Administered 2023-10-03: 75 mL via INTRAVENOUS

## 2023-10-03 MED ORDER — SODIUM CHLORIDE 0.9 % IV BOLUS
125.0000 mL | Freq: Once | INTRAVENOUS | Status: AC
  Administered 2023-10-03: 125 mL via INTRAVENOUS
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
## 2023-10-03 NOTE — Progress Notes (Signed)
 Orthopedic Tech Progress Note Patient Details:  Jason Coleman [birth year] [id number]  Patient ID: [employer], male   DOB: Aug 17, 1965, 58 y.o.   MRN: [id number] I attended trauma page. Trinna Post 10/03/2023, 8:56 PM
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
## 2023-10-04 LAB — URINALYSIS, ROUTINE W REFLEX MICROSCOPIC
Bilirubin Urine: NEGATIVE
Glucose, UA: NEGATIVE mg/dL
Hgb urine dipstick: NEGATIVE
Ketones, ur: 5 mg/dL — AB
Leukocytes,Ua: NEGATIVE
Nitrite: NEGATIVE
Protein, ur: NEGATIVE mg/dL
Specific Gravity, Urine: >1.046 — ABNORMAL HIGH (ref 1.005–1.030)
pH: 5 (ref 5.0–8.0)

## 2023-10-04 LAB — RAPID URINE DRUG SCREEN, HOSP PERFORMED
Amphetamines: NOT DETECTED
Barbiturates: NOT DETECTED
Benzodiazepines: NOT DETECTED
Cocaine: POSITIVE — AB
Opiates: NOT DETECTED
Tetrahydrocannabinol: POSITIVE — AB

## 2023-10-04 LAB — ETHANOL: Alcohol, Ethyl (B): 120 mg/dL — ABNORMAL HIGH (ref <10)
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
## 2023-10-04 NOTE — Discharge Instructions (Addendum)
 Please keep your wounds clean and dry.  You may apply barrier ointment such as Aquaphor or Vaseline to your abrasions to keep them supple.  Follow-up with your primary care doctor or the clinic listed below.  You may use over-the-counter medication such as Tylenol and ibuprofen for your symptoms.  Please follow below with the ear nose and throat doctor listed for further discussion on management of your facial fractures.  Return to the ER with any new severe symptoms.

## 2023-10-04 NOTE — ED Notes (Signed)
 Patient walk in hallway without assist.

## 2023-11-05 ENCOUNTER — Emergency Department (HOSPITAL_BASED_OUTPATIENT_CLINIC_OR_DEPARTMENT_OTHER): Admitting: Radiology

## 2023-11-05 ENCOUNTER — Encounter (HOSPITAL_BASED_OUTPATIENT_CLINIC_OR_DEPARTMENT_OTHER): Payer: Self-pay

## 2023-11-05 ENCOUNTER — Emergency Department (EMERGENCY_DEPARTMENT_HOSPITAL): Admitting: Anesthesiology

## 2023-11-05 ENCOUNTER — Other Ambulatory Visit: Payer: Self-pay

## 2023-11-05 ENCOUNTER — Emergency Department (HOSPITAL_BASED_OUTPATIENT_CLINIC_OR_DEPARTMENT_OTHER)
Admission: EM | Admit: 2023-11-05 | Discharge: 2023-11-05 | Disposition: A | Discharge status: Home / Self Care | Attending: Emergency Medicine | Admitting: Emergency Medicine

## 2023-11-05 ENCOUNTER — Encounter (HOSPITAL_COMMUNITY)
Admission: EM | Disposition: A | Discharge status: Home / Self Care | Payer: Self-pay | Source: Home / Self Care | Attending: Emergency Medicine

## 2023-11-05 ENCOUNTER — Emergency Department (HOSPITAL_COMMUNITY): Admitting: Anesthesiology

## 2023-11-05 ENCOUNTER — Emergency Department (HOSPITAL_COMMUNITY)

## 2023-11-05 DIAGNOSIS — F1910 Other psychoactive substance abuse, uncomplicated: Secondary | ICD-10-CM | POA: Insufficient documentation

## 2023-11-05 DIAGNOSIS — B9681 Helicobacter pylori [H. pylori] as the cause of diseases classified elsewhere: Secondary | ICD-10-CM | POA: Diagnosis not present

## 2023-11-05 DIAGNOSIS — F17200 Nicotine dependence, unspecified, uncomplicated: Secondary | ICD-10-CM | POA: Diagnosis not present

## 2023-11-05 DIAGNOSIS — K3189 Other diseases of stomach and duodenum: Secondary | ICD-10-CM | POA: Diagnosis not present

## 2023-11-05 DIAGNOSIS — I10 Essential (primary) hypertension: Secondary | ICD-10-CM | POA: Insufficient documentation

## 2023-11-05 DIAGNOSIS — K219 Gastro-esophageal reflux disease without esophagitis: Secondary | ICD-10-CM | POA: Insufficient documentation

## 2023-11-05 DIAGNOSIS — K269 Duodenal ulcer, unspecified as acute or chronic, without hemorrhage or perforation: Secondary | ICD-10-CM | POA: Insufficient documentation

## 2023-11-05 DIAGNOSIS — E872 Acidosis, unspecified: Secondary | ICD-10-CM | POA: Insufficient documentation

## 2023-11-05 DIAGNOSIS — R1310 Dysphagia, unspecified: Secondary | ICD-10-CM | POA: Diagnosis present

## 2023-11-05 DIAGNOSIS — K2970 Gastritis, unspecified, without bleeding: Secondary | ICD-10-CM | POA: Insufficient documentation

## 2023-11-05 DIAGNOSIS — K259 Gastric ulcer, unspecified as acute or chronic, without hemorrhage or perforation: Secondary | ICD-10-CM | POA: Diagnosis not present

## 2023-11-05 HISTORY — PX: BIOPSY OF SKIN SUBCUTANEOUS TISSUE AND/OR MUCOUS MEMBRANE: SHX6741

## 2023-11-05 HISTORY — PX: ESOPHAGOGASTRODUODENOSCOPY: SHX5428

## 2023-11-05 LAB — COMPREHENSIVE METABOLIC PANEL WITH GFR
ALT: 33 U/L (ref 0–44)
AST: 38 U/L (ref 15–41)
Albumin: 4.1 g/dL (ref 3.5–5.0)
Alkaline Phosphatase: 106 U/L (ref 38–126)
Anion gap: 17 — ABNORMAL HIGH (ref 5–15)
BUN: 7 mg/dL (ref 6–20)
CO2: 20 mmol/L — ABNORMAL LOW (ref 22–32)
Calcium: 9.8 mg/dL (ref 8.9–10.3)
Chloride: 101 mmol/L (ref 98–111)
Creatinine, Ser: 0.96 mg/dL (ref 0.61–1.24)
GFR, Estimated: >60 mL/min (ref >60)
Glucose, Bld: 106 mg/dL — ABNORMAL HIGH (ref 70–99)
Potassium: 4.1 mmol/L (ref 3.5–5.1)
Sodium: 137 mmol/L (ref 135–145)
Total Bilirubin: 0.5 mg/dL (ref 0.0–1.2)
Total Protein: 7.5 g/dL (ref 6.5–8.1)

## 2023-11-05 LAB — CBC WITH DIFFERENTIAL/PLATELET
Abs Immature Granulocytes: 0.01 10*3/uL (ref 0.00–0.07)
Basophils Absolute: 0 10*3/uL (ref 0.0–0.1)
Basophils Relative: 1 %
Eosinophils Absolute: 0.1 10*3/uL (ref 0.0–0.5)
Eosinophils Relative: 1 %
HCT: 38.6 % — ABNORMAL LOW (ref 39.0–52.0)
Hemoglobin: 13.2 g/dL (ref 13.0–17.0)
Immature Granulocytes: 0 %
Lymphocytes Relative: 31 %
Lymphs Abs: 1.8 10*3/uL (ref 0.7–4.0)
MCH: 29.9 pg (ref 26.0–34.0)
MCHC: 34.2 g/dL (ref 30.0–36.0)
MCV: 87.3 fL (ref 80.0–100.0)
Monocytes Absolute: 0.6 10*3/uL (ref 0.1–1.0)
Monocytes Relative: 10 %
Neutro Abs: 3.3 10*3/uL (ref 1.7–7.7)
Neutrophils Relative %: 57 %
Platelets: 334 10*3/uL (ref 150–400)
RBC: 4.42 MIL/uL (ref 4.22–5.81)
RDW: 13.7 % (ref 11.5–15.5)
WBC: 5.8 10*3/uL (ref 4.0–10.5)
nRBC: 0 % (ref 0.0–0.2)

## 2023-11-05 SURGERY — EGD (ESOPHAGOGASTRODUODENOSCOPY)
Anesthesia: General

## 2023-11-05 MED ORDER — OMEPRAZOLE 20 MG PO CPDR: 20.0000 mg | DELAYED_RELEASE_CAPSULE | Freq: Every day | ORAL | 0 refills | Status: DC

## 2023-11-05 MED ORDER — ALUM & MAG HYDROXIDE-SIMETH 200-200-20 MG/5ML PO SUSP
30.0000 mL | Freq: Once | ORAL | Status: AC
  Administered 2023-11-05: 30 mL via ORAL
  Filled 2023-11-05: qty 30

## 2023-11-05 MED ORDER — LIDOCAINE VISCOUS HCL 2 % MT SOLN
15.0000 mL | Freq: Once | OROMUCOSAL | Status: AC
  Administered 2023-11-05: 15 mL via ORAL
  Filled 2023-11-05: qty 15

## 2023-11-05 MED ORDER — SODIUM CHLORIDE 0.9 % IV SOLN: INTRAVENOUS | Status: DC | PRN

## 2023-11-05 MED ORDER — PROPOFOL 10 MG/ML IV BOLUS
INTRAVENOUS | Status: AC
  Filled 2023-11-05: qty 20

## 2023-11-05 MED ORDER — PROPOFOL 500 MG/50ML IV EMUL
INTRAVENOUS | Status: AC
Start: 2023-11-05 — End: ?
  Filled 2023-11-05: qty 50

## 2023-11-05 MED ORDER — SUCRALFATE 1 G PO TABS: 1.0000 g | ORAL_TABLET | Freq: Three times a day (TID) | ORAL | 0 refills | Status: DC

## 2023-11-05 MED ORDER — ONDANSETRON HCL 4 MG/2ML IJ SOLN
INTRAMUSCULAR | Status: DC | PRN
  Administered 2023-11-05: 4 mg via INTRAVENOUS

## 2023-11-05 MED ORDER — FENTANYL CITRATE (PF) 100 MCG/2ML IJ SOLN
INTRAMUSCULAR | Status: DC | PRN
Start: 2023-11-05 — End: 2023-11-05
  Administered 2023-11-05 (×2): 50 ug via INTRAVENOUS

## 2023-11-05 MED ORDER — LABETALOL HCL 5 MG/ML IV SOLN
INTRAVENOUS | Status: DC | PRN
  Administered 2023-11-05: 10 mg via INTRAVENOUS

## 2023-11-05 MED ORDER — FENTANYL CITRATE (PF) 100 MCG/2ML IJ SOLN
INTRAMUSCULAR | Status: AC
Start: 2023-11-05 — End: ?
  Filled 2023-11-05: qty 2

## 2023-11-05 MED ORDER — SUCCINYLCHOLINE CHLORIDE 200 MG/10ML IV SOSY
PREFILLED_SYRINGE | INTRAVENOUS | Status: DC | PRN
  Administered 2023-11-05: 200 mg via INTRAVENOUS

## 2023-11-05 MED ORDER — MORPHINE SULFATE (PF) 4 MG/ML IV SOLN
4.0000 mg | Freq: Once | INTRAVENOUS | Status: AC
  Administered 2023-11-05: 4 mg via INTRAVENOUS
  Filled 2023-11-05: qty 1

## 2023-11-05 MED ORDER — LIDOCAINE 2% (20 MG/ML) 5 ML SYRINGE
INTRAMUSCULAR | Status: DC | PRN
  Administered 2023-11-05: 100 mg via INTRAVENOUS

## 2023-11-05 MED ORDER — ONDANSETRON HCL 4 MG/2ML IJ SOLN
4.0000 mg | Freq: Once | INTRAMUSCULAR | Status: AC
  Administered 2023-11-05: 4 mg via INTRAVENOUS
  Filled 2023-11-05: qty 2

## 2023-11-05 MED ORDER — DEXAMETHASONE SODIUM PHOSPHATE 4 MG/ML IJ SOLN
INTRAMUSCULAR | Status: DC | PRN
  Administered 2023-11-05: 5 mg via INTRAVENOUS

## 2023-11-05 MED ORDER — PROPOFOL 10 MG/ML IV BOLUS
INTRAVENOUS | Status: DC | PRN
  Administered 2023-11-05: 200 mg via INTRAVENOUS

## 2023-11-05 MED ORDER — LIDOCAINE VISCOUS HCL 2 % MT SOLN
15.0000 mL | Freq: Once | OROMUCOSAL | Status: AC
  Administered 2023-11-05: 15 mL via OROMUCOSAL
  Filled 2023-11-05: qty 15

## 2023-11-05 NOTE — Discharge Instructions (Addendum)
 Please be sure to follow-up with your primary care physician.  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  Return here for concerning changes in your condition.

## 2023-11-05 NOTE — ED Provider Notes (Signed)
 Care of the patient assumed after he arrived to our facility.  He is awake, alert, continues to complain of pain.  Swallow study reviewed, discussed with GI, GI will eval.   Dorenda Gandy, MD 11/05/23 1217

## 2023-11-05 NOTE — ED Notes (Signed)
 This nurse called mother she will pick up patient in about 30 minutes
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
## 2023-11-05 NOTE — Op Note (Signed)
 [facility] Patient Name: Jason Coleman Procedure Date: 11/05/2023 MRN: [id number] Attending MD: Genell Ken , MD, [id number] Date of Birth: Nov 09, 1965 CSN: [id number] Age: 57 Admit Type: Inpatient Procedure:                Upper GI endoscopy Indications:              Acute dysphagia and acute odynophagia, suspected                            esophageal food bolus impaction Providers:                Genell Ken, MD, Lonzell Robin, RN, Alfredia Ina, Technician Referring MD:             ER Medicines:                Monitored Anesthesia Care Complications:            No immediate complications. Estimated blood loss:                            Minimal. Estimated Blood Loss:     Estimated blood loss was minimal. Procedure:                Pre-Anesthesia Assessment:                           - Prior to the procedure, a History and Physical                            was performed, and patient medications and                            allergies were reviewed. The patient's tolerance of                            previous anesthesia was also reviewed. The risks                            and benefits of the procedure and the sedation                            options and risks were discussed with the patient.                            All questions were answered, and informed consent                            was obtained. Prior Anticoagulants: The patient has                            taken no anticoagulant or antiplatelet agents. ASA                            Grade Assessment:  III, E - Emergency. After                            reviewing the risks and benefits, the patient was                            deemed in satisfactory condition to undergo the                            procedure.                           After obtaining informed consent, the endoscope was                            passed under direct vision. Throughout the                             procedure, the patient's blood pressure, pulse, and                            oxygen saturations were monitored continuously. The                            [id number]-[id number] ([id number]) Olympus endoscope was introduced                            through the mouth, and advanced to the second part                            of duodenum. The upper GI endoscopy was                            accomplished without difficulty. The patient                            tolerated the procedure well. Scope In: Scope Out: Findings:      The examined esophagus was normal.      The Z-line was regular and was found 40 cm from the incisors.      Localized severely congested mucosa was found in the cardia, likely from       retching.      Many non-bleeding linear and superficial gastric ulcers with a clean       ulcer base (Forrest Class III) were found in the gastric antrum and at       the pylorus. The largest lesion was 5 mm in largest dimension. Biopsies       were taken with a cold forceps for Helicobacter pylori testing.      Many non-bleeding superficial duodenal ulcers with a clean ulcer base       (Forrest Class III) were found in the duodenal bulb and in the first       portion of the duodenum. The largest lesion was 4 mm in largest       dimension.      Retained barium was noted in the fundus on retroflexion. Impression:               -  Normal esophagus.                           - Z-line regular, 40 cm from the incisors.                           - Congestive gastropathy.                           - Non-bleeding gastric ulcers with a clean ulcer                            base (Forrest Class III). Biopsied.                           - Non-bleeding duodenal ulcers with a clean ulcer                            base (Forrest Class III). Moderate Sedation:      Patient did not receive moderate sedation for this procedure, but       instead received monitored anesthesia  care. Recommendation:           - Patient has a contact number available for                            emergencies. The signs and symptoms of potential                            delayed complications were discussed with the                            patient. Return to normal activities tomorrow.                            Written discharge instructions were provided to the                            patient.                           - Advance diet as tolerated.                           - Continue present medications.                           - Await pathology results.                           - Avoid ASA and NSAIDs, take PPI BID for 2 months. Procedure Code(s):        --- Professional ---                           [phone], Esophagogastroduodenoscopy, flexible,  transoral; with biopsy, single or multiple Diagnosis Code(s):        --- Professional ---                           K31.89, Other diseases of stomach and duodenum                           K25.9, Gastric ulcer, unspecified as acute or                            chronic, without hemorrhage or perforation                           K26.9, Duodenal ulcer, unspecified as acute or                            chronic, without hemorrhage or perforation                           R13.10, Dysphagia, unspecified CPT copyright 2022 American [employer] Association. All rights reserved. The codes documented in this report are preliminary and upon coder review may  be revised to meet current compliance requirements. Genell Ken, MD 11/05/2023 2:49:02 PM This report has been signed electronically. Number of Addenda: 0

## 2023-11-05 NOTE — Interval H&P Note (Signed)
 History and Physical Interval Note: 58 year old male with acute dysphagia and odynophagia suspicious for esophageal food bolus impaction for an emergent EGD with propofol .  11/05/2023 2:20 PM  Jason Coleman  has presented today for EGD with propofol , with the diagnosis of Dysphagia, suspected esophageal food bolus impaction.  The various methods of treatment have been discussed with the patient and family. After consideration of risks, benefits and other options for treatment, the patient has consented to  Procedure(s): EGD (ESOPHAGOGASTRODUODENOSCOPY) (N/A) as a surgical intervention.  The patient's history has been reviewed, patient examined, no change in status, stable for surgery.  I have reviewed the patient's chart and labs.  Questions were answered to the patient's satisfaction.     Genell Ken

## 2023-11-05 NOTE — ED Provider Notes (Signed)
 Patient sleeping on return from endoscopy, vital signs stable.  Per GI patient appropriate for discharge when awake.   Dorenda Gandy, MD 11/05/23 [phone]

## 2023-11-05 NOTE — ED Triage Notes (Signed)
 Pt reports that he was eating something and felt like there is still something lodge in his throat. Pt reports he is unable to swallow anything because he feels like something is stuck in his throat. Pt reports N&V. Pt denies any abd pain. Pt airway intact. Pt able to speak in full and complete sentences. Pt denies any sob.

## 2023-11-05 NOTE — Addendum Note (Signed)
 Addendum  created 11/05/23 1510 by Rochell Chroman, CRNA   Flowsheet accepted

## 2023-11-05 NOTE — Anesthesia Preprocedure Evaluation (Addendum)
 Anesthesia Evaluation  Patient identified by MRN, date of birth, ID band Patient awake    Reviewed: Allergy & Precautions, NPO status , Patient's Chart, lab work & pertinent test results  History of Anesthesia Complications Negative for: history of anesthetic complications  Airway Mallampati: II  TM Distance: >3 FB Neck ROM: Full    Dental  (+) Poor Dentition, Missing, Dental Advisory Given   Pulmonary Current Smoker and Patient abstained from smoking.   breath sounds clear to auscultation       Cardiovascular hypertension (not on meds), (-) angina  Rhythm:Regular Rate:Normal     Neuro/Psych negative neurological ROS     GI/Hepatic ,,,(+)     substance abuse (Cocaine, ETOH, THC yesterday, no fentanyl  in months)  alcohol use, cocaine use and marijuana usePossible foreign body/food impaction in esophagus   Endo/Other  negative endocrine ROS    Renal/GU negative Renal ROS     Musculoskeletal   Abdominal   Peds  Hematology   Anesthesia Other Findings   Reproductive/Obstetrics                             Anesthesia Physical Anesthesia Plan  ASA: 3 and emergent  Anesthesia Plan: General   Post-op Pain Management: Minimal or no pain anticipated   Induction: Intravenous  PONV Risk Score and Plan: 1 and Ondansetron  and Dexamethasone   Airway Management Planned: Oral ETT  Additional Equipment: None  Intra-op Plan:   Post-operative Plan: Extubation in OR  Informed Consent: I have reviewed the patients History and Physical, chart, labs and discussed the procedure including the risks, benefits and alternatives for the proposed anesthesia with the patient or authorized representative who has indicated his/her understanding and acceptance.     Dental advisory given  Plan Discussed with: CRNA and Surgeon  Anesthesia Plan Comments:         Anesthesia Quick Evaluation

## 2023-11-05 NOTE — Anesthesia Procedure Notes (Signed)
 Procedure Name: Intubation Date/Time: 11/05/2023 2:33 PM  Performed by: Rochell Chroman, CRNAPre-anesthesia Checklist: Patient identified, Emergency Drugs available, Suction available and Patient being monitored Patient Re-evaluated:Patient Re-evaluated prior to induction Oxygen Delivery Method: Circle system utilized Preoxygenation: Pre-oxygenation with 100% oxygen Induction Type: IV induction, Rapid sequence and Cricoid Pressure applied Ventilation: Mask ventilation without difficulty Laryngoscope Size: 2 and Miller Grade View: Grade I Tube type: Oral Tube size: 7.5 mm Number of attempts: 1 Airway Equipment and Method: Stylet Placement Confirmation: ETT inserted through vocal cords under direct vision, positive ETCO2 and breath sounds checked- equal and bilateral Secured at: 22 cm Tube secured with: Tape Dental Injury: Teeth and Oropharynx as per pre-operative assessment

## 2023-11-05 NOTE — Consult Note (Signed)
 [facility] Consult  Referring Provider: ER Primary Care Physician:  [facility] Primary Gastroenterologist: Para Bold  Reason for Consultation: Dysphagia  HPI: Jason Coleman is a 58 y.o. male states that he is not able to eat or drink anything since attempting to eat bread yesterday at 5 PM.  He felt the bread was stuck in his throat and he regurgitated it out.  Since then he has not been able to eat or swallow liquids.  Patient denies prior history of difficulty swallowing or pain on swallowing.  He has history of acid reflux and heartburn and takes over-the-counter medications if needed, has not needed it recently Denies unintentional weight loss loss of appetite. No prior EGD or colonoscopy.   History reviewed. No pertinent past medical history.  History reviewed. No pertinent surgical history.  Prior to Admission medications   Not on File    No current facility-administered medications for this encounter.   No current outpatient medications on file.    Allergies as of 11/05/2023   (No Known Allergies)    History reviewed. No pertinent family history.  Social History   Socioeconomic History   Marital status: Single    Spouse name: Not on file   Number of children: Not on file   Years of education: Not on file   Highest education level: Not on file  Occupational History   Not on file  Tobacco Use   Smoking status: Every Day   Smokeless tobacco: Not on file  Substance and Sexual Activity   Alcohol use: Yes   Drug use: Yes    Types: Marijuana   Sexual activity: Not on file  Other Topics Concern   Not on file  Social History Narrative   Not on file   Social Drivers of Health   Financial Resource Strain: Not on file  Food Insecurity: Not on file  Transportation Needs: Not on file  Physical Activity: Not on file  Stress: Not on file  Social Connections: Not on file  Intimate Partner Violence: Not on file    Review of Systems: As per  HPI  Physical Exam: Vital signs in last 24 hours: Temp:  [97.7 F (36.5 C)-98.1 F (36.7 C)] 98.1 F (36.7 C) (05/01 1232) Pulse Rate:  [67-108] 82 (05/01 1130) Resp:  [15-19] 18 (05/01 1130) BP: (162-191)/(84-116) 162/84 (05/01 1130) SpO2:  [95 %-100 %] 96 % (05/01 1130)    General:   Alert,  Well-developed, well-nourished, pleasant and cooperative in NAD Head:  Normocephalic and atraumatic. Eyes:  Sclera clear, no icterus.   Conjunctiva pink. Ears:  Normal auditory acuity. Nose:  No deformity, discharge,  or lesions. Mouth:  No deformity or lesions.  Oropharynx pink & moist. Neck:  Supple; no masses or thyromegaly. Lungs:  Clear throughout to auscultation.   No wheezes, crackles, or rhonchi. No acute distress. Heart:  Regular rate and rhythm; no murmurs, clicks, rubs,  or gallops. Extremities:  Without clubbing or edema. Neurologic:  Alert and  oriented x4;  grossly normal neurologically. Skin:  Intact without significant lesions or rashes. Psych:  Alert and cooperative. Normal mood and affect. Abdomen:  Soft, nontender and nondistended. No masses, hepatosplenomegaly or hernias noted. Normal bowel sounds, without guarding, and without rebound.         Lab Results: Recent Labs    11/05/23 0459  WBC 5.8  HGB 13.2  HCT 38.6*  PLT 334   BMET Recent Labs    11/05/23 0459  NA 137  K 4.1  CL 101  CO2 20*  GLUCOSE 106*  BUN 7  CREATININE 0.96  CALCIUM 9.8   LFT Recent Labs    11/05/23 0459  PROT 7.5  ALBUMIN 4.1  AST 38  ALT 33  ALKPHOS 106  BILITOT 0.5   PT/INR No results for input(s): "LABPROT", "INR" in the last 72 hours.  Studies/Results: DG ESOPHAGUS W SINGLE CM (SOL OR THIN BA) Result Date: 11/05/2023 CLINICAL DATA:  [id number] Dysphagia [id number]. EXAM: ESOPHOGRAM/BARIUM SWALLOW TECHNIQUE: Single contrast examination was performed using thin barium. FLUOROSCOPY: Radiation Exposure Index (as provided by the fluoroscopic device): 122.3 mGy Kerma  COMPARISON:  None Available. FINDINGS: Examination was limited due to patient related factors. Examination was performed with patient in 45 degrees inclined supine position. Scout: Visualized bilateral lungs are clear. No free air under the domes of diaphragm. Esophageal motility is normal. There is no stricture, ring or web. No hiatal hernia. No evidence of barium leak to suggest esophageal perforation. No radiolucent filling defects noted to suggest foreign body. No spontaneous gastro-esophageal reflux noted. IMPRESSION: 1. Limited exam. 2. No filling defect to suggest foreign body. 3. Normal esophageal motility. No barium leak to suggest esophageal perforation. Electronically Signed   By: Beula Brunswick M.D.   On: 11/05/2023 11:22   DG Neck Soft Tissue Result Date: 11/05/2023 CLINICAL DATA:  Possible ingested foreign body. EXAM: NECK SOFT TISSUES - 1+ VIEW COMPARISON:  None Available. FINDINGS: There is no evidence of retropharyngeal soft tissue swelling or epiglottic enlargement. The cervical airway is unremarkable and no unexpected radio-opaque foreign body identified. IMPRESSION: Negative. Electronically Signed   By: Donnal Fusi M.D.   On: 11/05/2023 05:26   DG Chest 2 View Result Date: 11/05/2023 CLINICAL DATA:  At the possible ingested foreign body. EXAM: CHEST - 2 VIEW COMPARISON:  10/03/2023 FINDINGS: The heart size and mediastinal contours are within normal limits. Both lungs are clear. The visualized skeletal structures are unremarkable. No evidence for unexpected radiopaque foreign body over the mediastinum or upper abdomen. IMPRESSION: No active cardiopulmonary disease. Electronically Signed   By: Donnal Fusi M.D.   On: 11/05/2023 05:24    Impression: Possible esophageal food bolus impaction Dysphagia-unable to eat or drink since 5 PM yesterday   Barium swallow: Unremarkable Soft tissue neck: Unremarkable Chest x-ray: unremarkable  Mild acidosis bicarb 20 Significant alcohol use,  blood alcohol was 120 on 2/29/2025 Polysubstance abuse, U tox was positive for cocaine and THC on 10/04/2023 Patient also admits to snorting fentanyl  occasionally  Plan: EGD today. The risks and the benefits of the procedure were discussed with the patient in details. He understands and verbalizes consent.   LOS: 0 days   Genell Ken, MD  11/05/2023, 12:52 PM

## 2023-11-05 NOTE — H&P (View-Only) (Signed)
 [facility] Consult  Referring Provider: ER Primary Care Physician:  [facility] Primary Gastroenterologist: Para Bold  Reason for Consultation: Dysphagia  HPI: Jason Coleman is a 58 y.o. male states that he is not able to eat or drink anything since attempting to eat bread yesterday at 5 PM.  He felt the bread was stuck in his throat and he regurgitated it out.  Since then he has not been able to eat or swallow liquids.  Patient denies prior history of difficulty swallowing or pain on swallowing.  He has history of acid reflux and heartburn and takes over-the-counter medications if needed, has not needed it recently Denies unintentional weight loss loss of appetite. No prior EGD or colonoscopy.   History reviewed. No pertinent past medical history.  History reviewed. No pertinent surgical history.  Prior to Admission medications   Not on File    No current facility-administered medications for this encounter.   No current outpatient medications on file.    Allergies as of 11/05/2023   (No Known Allergies)    History reviewed. No pertinent family history.  Social History   Socioeconomic History   Marital status: Single    Spouse name: Not on file   Number of children: Not on file   Years of education: Not on file   Highest education level: Not on file  Occupational History   Not on file  Tobacco Use   Smoking status: Every Day   Smokeless tobacco: Not on file  Substance and Sexual Activity   Alcohol use: Yes   Drug use: Yes    Types: Marijuana   Sexual activity: Not on file  Other Topics Concern   Not on file  Social History Narrative   Not on file   Social Drivers of Health   Financial Resource Strain: Not on file  Food Insecurity: Not on file  Transportation Needs: Not on file  Physical Activity: Not on file  Stress: Not on file  Social Connections: Not on file  Intimate Partner Violence: Not on file    Review of Systems: As per  HPI  Physical Exam: Vital signs in last 24 hours: Temp:  [97.7 F (36.5 C)-98.1 F (36.7 C)] 98.1 F (36.7 C) (05/01 1232) Pulse Rate:  [67-108] 82 (05/01 1130) Resp:  [15-19] 18 (05/01 1130) BP: (162-191)/(84-116) 162/84 (05/01 1130) SpO2:  [95 %-100 %] 96 % (05/01 1130)    General:   Alert,  Well-developed, well-nourished, pleasant and cooperative in NAD Head:  Normocephalic and atraumatic. Eyes:  Sclera clear, no icterus.   Conjunctiva pink. Ears:  Normal auditory acuity. Nose:  No deformity, discharge,  or lesions. Mouth:  No deformity or lesions.  Oropharynx pink & moist. Neck:  Supple; no masses or thyromegaly. Lungs:  Clear throughout to auscultation.   No wheezes, crackles, or rhonchi. No acute distress. Heart:  Regular rate and rhythm; no murmurs, clicks, rubs,  or gallops. Extremities:  Without clubbing or edema. Neurologic:  Alert and  oriented x4;  grossly normal neurologically. Skin:  Intact without significant lesions or rashes. Psych:  Alert and cooperative. Normal mood and affect. Abdomen:  Soft, nontender and nondistended. No masses, hepatosplenomegaly or hernias noted. Normal bowel sounds, without guarding, and without rebound.         Lab Results: Recent Labs    11/05/23 0459  WBC 5.8  HGB 13.2  HCT 38.6*  PLT 334   BMET Recent Labs    11/05/23 0459  NA 137  K 4.1  CL 101  CO2 20*  GLUCOSE 106*  BUN 7  CREATININE 0.96  CALCIUM 9.8   LFT Recent Labs    11/05/23 0459  PROT 7.5  ALBUMIN 4.1  AST 38  ALT 33  ALKPHOS 106  BILITOT 0.5   PT/INR No results for input(s): "LABPROT", "INR" in the last 72 hours.  Studies/Results: DG ESOPHAGUS W SINGLE CM (SOL OR THIN BA) Result Date: 11/05/2023 CLINICAL DATA:  [id number] Dysphagia [id number]. EXAM: ESOPHOGRAM/BARIUM SWALLOW TECHNIQUE: Single contrast examination was performed using thin barium. FLUOROSCOPY: Radiation Exposure Index (as provided by the fluoroscopic device): 122.3 mGy Kerma  COMPARISON:  None Available. FINDINGS: Examination was limited due to patient related factors. Examination was performed with patient in 45 degrees inclined supine position. Scout: Visualized bilateral lungs are clear. No free air under the domes of diaphragm. Esophageal motility is normal. There is no stricture, ring or web. No hiatal hernia. No evidence of barium leak to suggest esophageal perforation. No radiolucent filling defects noted to suggest foreign body. No spontaneous gastro-esophageal reflux noted. IMPRESSION: 1. Limited exam. 2. No filling defect to suggest foreign body. 3. Normal esophageal motility. No barium leak to suggest esophageal perforation. Electronically Signed   By: Beula Brunswick M.D.   On: 11/05/2023 11:22   DG Neck Soft Tissue Result Date: 11/05/2023 CLINICAL DATA:  Possible ingested foreign body. EXAM: NECK SOFT TISSUES - 1+ VIEW COMPARISON:  None Available. FINDINGS: There is no evidence of retropharyngeal soft tissue swelling or epiglottic enlargement. The cervical airway is unremarkable and no unexpected radio-opaque foreign body identified. IMPRESSION: Negative. Electronically Signed   By: Donnal Fusi M.D.   On: 11/05/2023 05:26   DG Chest 2 View Result Date: 11/05/2023 CLINICAL DATA:  At the possible ingested foreign body. EXAM: CHEST - 2 VIEW COMPARISON:  10/03/2023 FINDINGS: The heart size and mediastinal contours are within normal limits. Both lungs are clear. The visualized skeletal structures are unremarkable. No evidence for unexpected radiopaque foreign body over the mediastinum or upper abdomen. IMPRESSION: No active cardiopulmonary disease. Electronically Signed   By: Donnal Fusi M.D.   On: 11/05/2023 05:24    Impression: Possible esophageal food bolus impaction Dysphagia-unable to eat or drink since 5 PM yesterday   Barium swallow: Unremarkable Soft tissue neck: Unremarkable Chest x-ray: unremarkable  Mild acidosis bicarb 20 Significant alcohol use,  blood alcohol was 120 on 2/29/2025 Polysubstance abuse, U tox was positive for cocaine and THC on 10/04/2023 Patient also admits to snorting fentanyl  occasionally  Plan: EGD today. The risks and the benefits of the procedure were discussed with the patient in details. He understands and verbalizes consent.   LOS: 0 days   Genell Ken, MD  11/05/2023, 12:52 PM

## 2023-11-05 NOTE — Anesthesia Postprocedure Evaluation (Signed)
 Anesthesia Post Note  Patient: Jason Coleman  Procedure(s) Performed: EGD (ESOPHAGOGASTRODUODENOSCOPY) BIOPSY, SKIN, SUBCUTANEOUS TISSUE, OR MUCOUS MEMBRANE     Patient location during evaluation: PACU Anesthesia Type: General Level of consciousness: awake and alert, patient cooperative and oriented Pain management: pain level controlled Vital Signs Assessment: post-procedure vital signs reviewed and stable Respiratory status: spontaneous breathing, nonlabored ventilation and respiratory function stable Cardiovascular status: blood pressure returned to baseline and stable Postop Assessment: no apparent nausea or vomiting Anesthetic complications: no   No notable events documented.  Last Vitals:  Vitals:   11/05/23 1455 11/05/23 1500  BP:    Pulse: 86 86  Resp: 12 20  Temp:    SpO2: 96% 98%    Last Pain:  Vitals:   11/05/23 1450  TempSrc:   PainSc: 0-No pain                 Rayfield Beem,E. Miryam Mcelhinney

## 2023-11-05 NOTE — ED Notes (Signed)
 Report given to ED charge at [facility].

## 2023-11-05 NOTE — ED Notes (Signed)
 Patient transported to X-ray

## 2023-11-05 NOTE — ED Provider Notes (Signed)
 Per Dr. Feliberto Hopping, patient ok for d/c once he returns from [place], MD 11/05/23 [phone]

## 2023-11-05 NOTE — Transfer of Care (Signed)
 Immediate Anesthesia Transfer of Care Note  Patient: Jason Coleman  Procedure(s) Performed: EGD (ESOPHAGOGASTRODUODENOSCOPY) BIOPSY, SKIN, SUBCUTANEOUS TISSUE, OR MUCOUS MEMBRANE  Patient Location: PACU  Anesthesia Type:General  Level of Consciousness: awake and patient cooperative  Airway & Oxygen Therapy: Patient Spontanous Breathing and Patient connected to face mask  Post-op Assessment: Report given to RN and Post -op Vital signs reviewed and stable  Post vital signs: Reviewed and stable  Last Vitals:  Vitals Value Taken Time  BP 155/83 11/05/23 1448  Temp    Pulse 82 11/05/23 1449  Resp 22 11/05/23 1449  SpO2 97 % 11/05/23 1449  Vitals shown include unfiled device data.  Last Pain:  Vitals:   11/05/23 1347  TempSrc: Temporal  PainSc: 0-No pain         Complications: No notable events documented.

## 2023-11-06 LAB — SURGICAL PATHOLOGY

## 2023-11-08 ENCOUNTER — Encounter (HOSPITAL_COMMUNITY): Payer: Self-pay | Admitting: Gastroenterology

## 2023-11-09 ENCOUNTER — Emergency Department (HOSPITAL_COMMUNITY)

## 2023-11-09 ENCOUNTER — Emergency Department (HOSPITAL_COMMUNITY)
Admission: EM | Admit: 2023-11-09 | Discharge: 2023-11-10 | Disposition: A | Discharge status: Home / Self Care | Attending: Emergency Medicine | Admitting: Emergency Medicine

## 2023-11-09 DIAGNOSIS — S81812A Laceration without foreign body, left lower leg, initial encounter: Secondary | ICD-10-CM

## 2023-11-09 DIAGNOSIS — S0990XA Unspecified injury of head, initial encounter: Secondary | ICD-10-CM

## 2023-11-09 DIAGNOSIS — Z23 Encounter for immunization: Secondary | ICD-10-CM | POA: Diagnosis not present

## 2023-11-09 DIAGNOSIS — S60511A Abrasion of right hand, initial encounter: Secondary | ICD-10-CM | POA: Diagnosis not present

## 2023-11-09 DIAGNOSIS — R21 Rash and other nonspecific skin eruption: Secondary | ICD-10-CM | POA: Insufficient documentation

## 2023-11-09 DIAGNOSIS — T07XXXA Unspecified multiple injuries, initial encounter: Secondary | ICD-10-CM

## 2023-11-09 DIAGNOSIS — Y92410 Unspecified street and highway as the place of occurrence of the external cause: Secondary | ICD-10-CM | POA: Diagnosis not present

## 2023-11-09 DIAGNOSIS — S60512A Abrasion of left hand, initial encounter: Secondary | ICD-10-CM | POA: Insufficient documentation

## 2023-11-09 LAB — I-STAT CG4 LACTIC ACID, ED: Lactic Acid, Venous: 1.8 mmol/L (ref 0.5–1.9)

## 2023-11-09 LAB — I-STAT CHEM 8, ED
BUN: 12 mg/dL (ref 6–20)
Calcium, Ion: 1.12 mmol/L — ABNORMAL LOW (ref 1.15–1.40)
Chloride: 105 mmol/L (ref 98–111)
Creatinine, Ser: 1.2 mg/dL (ref 0.61–1.24)
Glucose, Bld: 83 mg/dL (ref 70–99)
HCT: 40 % (ref 39.0–52.0)
Hemoglobin: 13.6 g/dL (ref 13.0–17.0)
Potassium: 4.2 mmol/L (ref 3.5–5.1)
Sodium: 141 mmol/L (ref 135–145)
TCO2: 24 mmol/L (ref 22–32)

## 2023-11-09 LAB — CBC
HCT: 39.1 % (ref 39.0–52.0)
Hemoglobin: 12.8 g/dL — ABNORMAL LOW (ref 13.0–17.0)
MCH: 29.8 pg (ref 26.0–34.0)
MCHC: 32.7 g/dL (ref 30.0–36.0)
MCV: 91.1 fL (ref 80.0–100.0)
Platelets: 329 10*3/uL (ref 150–400)
RBC: 4.29 MIL/uL (ref 4.22–5.81)
RDW: 13.4 % (ref 11.5–15.5)
WBC: 6.3 10*3/uL (ref 4.0–10.5)
nRBC: 0 % (ref 0.0–0.2)

## 2023-11-09 LAB — PROTIME-INR
INR: 0.9 (ref 0.8–1.2)
Prothrombin Time: 12.6 s (ref 11.4–15.2)

## 2023-11-09 LAB — SAMPLE TO BLOOD BANK

## 2023-11-09 MED ORDER — TETANUS-DIPHTH-ACELL PERTUSSIS 5-2.5-18.5 LF-MCG/0.5 IM SUSY
0.5000 mL | PREFILLED_SYRINGE | Freq: Once | INTRAMUSCULAR | Status: AC
  Administered 2023-11-09: 0.5 mL via INTRAMUSCULAR
  Filled 2023-11-09: qty 0.5

## 2023-11-09 MED ORDER — IOHEXOL 350 MG/ML SOLN
75.0000 mL | Freq: Once | INTRAVENOUS | Status: AC | PRN
  Administered 2023-11-09: 75 mL via INTRAVENOUS

## 2023-11-09 MED ORDER — CEFAZOLIN SODIUM-DEXTROSE 2-4 GM/100ML-% IV SOLN
2.0000 g | Freq: Once | INTRAVENOUS | Status: AC
  Administered 2023-11-09: 2 g via INTRAVENOUS
  Filled 2023-11-09: qty 100
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
## 2023-11-10 ENCOUNTER — Encounter (HOSPITAL_COMMUNITY): Payer: Self-pay | Admitting: Gastroenterology

## 2023-11-10 ENCOUNTER — Other Ambulatory Visit: Payer: Self-pay

## 2023-11-10 ENCOUNTER — Emergency Department (HOSPITAL_COMMUNITY)

## 2023-11-10 ENCOUNTER — Encounter (HOSPITAL_COMMUNITY): Payer: Self-pay | Admitting: Surgery

## 2023-11-10 LAB — RAPID URINE DRUG SCREEN, HOSP PERFORMED
Amphetamines: NOT DETECTED
Barbiturates: NOT DETECTED
Benzodiazepines: NOT DETECTED
Cocaine: NOT DETECTED
Opiates: NOT DETECTED
Tetrahydrocannabinol: POSITIVE — AB

## 2023-11-10 LAB — COMPREHENSIVE METABOLIC PANEL WITH GFR
ALT: 25 U/L (ref 0–44)
AST: 29 U/L (ref 15–41)
Albumin: 3.4 g/dL — ABNORMAL LOW (ref 3.5–5.0)
Alkaline Phosphatase: 72 U/L (ref 38–126)
Anion gap: 12 (ref 5–15)
BUN: 12 mg/dL (ref 6–20)
CO2: 23 mmol/L (ref 22–32)
Calcium: 9.1 mg/dL (ref 8.9–10.3)
Chloride: 103 mmol/L (ref 98–111)
Creatinine, Ser: 1.02 mg/dL (ref 0.61–1.24)
GFR, Estimated: >60 mL/min (ref >60)
Glucose, Bld: 87 mg/dL (ref 70–99)
Potassium: 4.3 mmol/L (ref 3.5–5.1)
Sodium: 138 mmol/L (ref 135–145)
Total Bilirubin: 0.3 mg/dL (ref 0.0–1.2)
Total Protein: 6.9 g/dL (ref 6.5–8.1)

## 2023-11-10 LAB — ETHANOL: Alcohol, Ethyl (B): 159 mg/dL — ABNORMAL HIGH (ref <15)

## 2023-11-10 LAB — URINALYSIS, ROUTINE W REFLEX MICROSCOPIC
Bilirubin Urine: NEGATIVE
Glucose, UA: NEGATIVE mg/dL
Hgb urine dipstick: NEGATIVE
Ketones, ur: NEGATIVE mg/dL
Leukocytes,Ua: NEGATIVE
Nitrite: NEGATIVE
Protein, ur: NEGATIVE mg/dL
Specific Gravity, Urine: 1.027 (ref 1.005–1.030)
pH: 5 (ref 5.0–8.0)

## 2023-11-10 MED ORDER — CEPHALEXIN 500 MG PO CAPS: 500.0000 mg | ORAL_CAPSULE | Freq: Four times a day (QID) | ORAL | 0 refills | Status: AC

## 2023-11-10 MED ORDER — LIDOCAINE-EPINEPHRINE (PF) 2 %-1:200000 IJ SOLN
20.0000 mL | Freq: Once | INTRAMUSCULAR | Status: AC
  Administered 2023-11-10: 20 mL
  Filled 2023-11-10: qty 20

## 2023-11-10 NOTE — Discharge Instructions (Addendum)
 Your staples will need to come out in the next 10 to 12 days.  Please follow-up in the clinic or the emergency department for repeat evaluation.
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
## 2023-11-10 NOTE — ED Notes (Signed)
 Wound clean to left shin with NS, zeroform applied and wrapped. No acute distress noted at this time.
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
## 2023-12-05 ENCOUNTER — Encounter (HOSPITAL_BASED_OUTPATIENT_CLINIC_OR_DEPARTMENT_OTHER): Payer: Self-pay

## 2023-12-05 ENCOUNTER — Emergency Department (HOSPITAL_BASED_OUTPATIENT_CLINIC_OR_DEPARTMENT_OTHER)
Admission: EM | Admit: 2023-12-05 | Discharge: 2023-12-05 | Disposition: A | Discharge status: Home / Self Care | Payer: MEDICAID

## 2023-12-05 DIAGNOSIS — Z4802 Encounter for removal of sutures: Secondary | ICD-10-CM | POA: Insufficient documentation

## 2023-12-05 NOTE — ED Triage Notes (Signed)
 He states he is here for staple removal from left leg. He denies fever, nor any issues with his sutured wound. He tells me the staples have been in "just over two weeks".

## 2023-12-05 NOTE — Discharge Instructions (Signed)
 Keep the wound clean until completely healed. You do not have to keep it bandaged.   Follow up with your doctor for recheck of the leg swelling if it does not continue to improve.
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
## 2023-12-05 NOTE — ED Notes (Signed)
 Wound care performed per EDP order after sutures removed by EDP. Wound cleansed, bacitracin  ointment applied. Non-stick pad applied with kurlex in circumstance of minimal bleeding. Bleeding controlled. Pt educated on wound care, pt verbalized understanding.

## 2024-05-17 ENCOUNTER — Emergency Department (HOSPITAL_BASED_OUTPATIENT_CLINIC_OR_DEPARTMENT_OTHER)
Admission: EM | Admit: 2024-05-17 | Discharge: 2024-05-17 | Disposition: A | Discharge status: Home / Self Care | Attending: Emergency Medicine | Admitting: Emergency Medicine

## 2024-05-17 ENCOUNTER — Other Ambulatory Visit: Payer: Self-pay

## 2024-05-17 ENCOUNTER — Encounter (HOSPITAL_BASED_OUTPATIENT_CLINIC_OR_DEPARTMENT_OTHER): Payer: Self-pay

## 2024-05-17 DIAGNOSIS — F1123 Opioid dependence with withdrawal: Secondary | ICD-10-CM | POA: Insufficient documentation

## 2024-05-17 DIAGNOSIS — F1193 Opioid use, unspecified with withdrawal: Secondary | ICD-10-CM

## 2024-05-17 LAB — CBC
HCT: 39.7 % (ref 39.0–52.0)
Hemoglobin: 13.4 g/dL (ref 13.0–17.0)
MCH: 29.7 pg (ref 26.0–34.0)
MCHC: 33.8 g/dL (ref 30.0–36.0)
MCV: 88 fL (ref 80.0–100.0)
Platelets: 283 K/uL (ref 150–400)
RBC: 4.51 MIL/uL (ref 4.22–5.81)
RDW: 13.7 % (ref 11.5–15.5)
WBC: 4.4 K/uL (ref 4.0–10.5)
nRBC: 0 % (ref 0.0–0.2)

## 2024-05-17 LAB — COMPREHENSIVE METABOLIC PANEL WITH GFR
ALT: 11 U/L (ref 0–44)
AST: 21 U/L (ref 15–41)
Albumin: 4.3 g/dL (ref 3.5–5.0)
Alkaline Phosphatase: 92 U/L (ref 38–126)
Anion gap: 15 (ref 5–15)
BUN: 9 mg/dL (ref 6–20)
CO2: 21 mmol/L — ABNORMAL LOW (ref 22–32)
Calcium: 9.2 mg/dL (ref 8.9–10.3)
Chloride: 106 mmol/L (ref 98–111)
Creatinine, Ser: 0.88 mg/dL (ref 0.61–1.24)
GFR, Estimated: >60 mL/min (ref >60)
Glucose, Bld: 85 mg/dL (ref 70–99)
Potassium: 3.9 mmol/L (ref 3.5–5.1)
Sodium: 142 mmol/L (ref 135–145)
Total Bilirubin: 0.3 mg/dL (ref 0.0–1.2)
Total Protein: 7.1 g/dL (ref 6.5–8.1)

## 2024-05-17 LAB — ETHANOL: Alcohol, Ethyl (B): 21 mg/dL — ABNORMAL HIGH (ref <15)

## 2024-05-17 MED ORDER — LOPERAMIDE HCL 2 MG PO CAPS: 2.0000 mg | ORAL_CAPSULE | Freq: Four times a day (QID) | ORAL | 0 refills | Status: AC | PRN

## 2024-05-17 MED ORDER — ONDANSETRON 4 MG PO TBDP
4.0000 mg | ORAL_TABLET | Freq: Once | ORAL | Status: AC
  Administered 2024-05-17: 4 mg via ORAL
  Filled 2024-05-17: qty 1

## 2024-05-17 MED ORDER — CLONIDINE HCL 0.1 MG PO TABS
0.1000 mg | ORAL_TABLET | Freq: Once | ORAL | Status: AC
  Administered 2024-05-17: 0.1 mg via ORAL
  Filled 2024-05-17: qty 1

## 2024-05-17 MED ORDER — METHOCARBAMOL 500 MG PO TABS
500.0000 mg | ORAL_TABLET | Freq: Once | ORAL | Status: AC
  Administered 2024-05-17: 500 mg via ORAL
  Filled 2024-05-17: qty 1

## 2024-05-17 MED ORDER — METHOCARBAMOL 500 MG PO TABS: 500.0000 mg | ORAL_TABLET | Freq: Three times a day (TID) | ORAL | 0 refills | Status: AC | PRN

## 2024-05-17 MED ORDER — ONDANSETRON 4 MG PO TBDP: 4.0000 mg | ORAL_TABLET | Freq: Three times a day (TID) | ORAL | 0 refills | Status: AC | PRN

## 2024-05-17 NOTE — ED Triage Notes (Signed)
 States he has been out of methadone for 3 days. Reports chest and back pain, headaches, N/V.  Having jerking/spastic movements while in triage, states it is caused by withdrawal Denies [facility]

## 2024-05-17 NOTE — ED Provider Notes (Signed)
 Emergency Department Provider Note   I have reviewed the triage vital signs and the nursing notes.   HISTORY  Chief Complaint Withdrawal   HPI Jason Coleman is a 58 y.o. male with past history of opiate abuse on methadone presents to the emergency department with withdrawal symptoms worsening over the past 3 days.  He has missed 3 days of his methadone dose due to cost issues.  He tells me that when money gets tight he often tries to miss several days until he can afford to get his methadone again.  He states tomorrow will be day 4.  He has headache and cramping of his arms and legs.  He is hoping to have a dose of methadone to bridge the gap.  He is unsure of his dose.   History reviewed. No pertinent past medical history.  Review of Systems  Constitutional: No fever/chills Cardiovascular: Denies chest pain. Respiratory: Denies shortness of breath. Gastrointestinal: Positive abdominal pain. Positive nausea.  Genitourinary: Negative for dysuria. Musculoskeletal: Positive for back pain. Skin: Negative for rash. Neurological: Positive HA.   ____________________________________________   PHYSICAL EXAM:  VITAL SIGNS: ED Triage Vitals  Encounter Vitals Group     BP 05/17/24 1513 (!) 185/92     Pulse Rate 05/17/24 1513 (!) 107     Resp 05/17/24 1513 18     Temp 05/17/24 1513 99.1 F (37.3 C)     Temp src --      SpO2 05/17/24 1513 97 %   Constitutional: Alert and oriented. Patient appears uncomfortable but able to provide a full history.  Eyes: Conjunctivae are normal.  Head: Atraumatic. Nose: No congestion/rhinnorhea. Mouth/Throat: Mucous membranes are moist. Neck: No stridor.   Cardiovascular: Normal rate, regular rhythm. Good peripheral circulation. Grossly normal heart sounds.   Respiratory: Normal respiratory effort.  No retractions. Lungs CTAB. Gastrointestinal: Soft and nontender. No distention.  Musculoskeletal: No gross deformities of  extremities. Neurologic:  Normal speech and language.  Skin:  Skin is warm, dry and intact. No rash noted.  ____________________________________________   LABS (all labs ordered are listed, but only abnormal results are displayed)  Labs Reviewed  COMPREHENSIVE METABOLIC PANEL WITH GFR - Abnormal; Notable for the following components:      Result Value   CO2 21 (*)    All other components within normal limits  ETHANOL - Abnormal; Notable for the following components:   Alcohol, Ethyl (B) 21 (*)    All other components within normal limits  CBC  URINE DRUG SCREEN   ____________________________________________   PROCEDURES  Procedure(s) performed:   Procedures  None  ____________________________________________   INITIAL IMPRESSION / ASSESSMENT AND PLAN / ED COURSE  Pertinent labs & imaging results that were available during my care of the patient were reviewed by me and considered in my medical decision making (see chart for details).   This patient is Presenting for Evaluation of cramping pain, which does require a range of treatment options, and is a complaint that involves a moderate risk of morbidity and mortality.  The Differential Diagnoses include opiate withdrawal, diversion, AKI, electrolyte disturbance, etc.  Critical Interventions-    Medications  cloNIDine (CATAPRES) tablet 0.1 mg (0.1 mg Oral Given 05/17/24 1611)  ondansetron  (ZOFRAN -ODT) disintegrating tablet 4 mg (4 mg Oral Given 05/17/24 1611)  methocarbamol (ROBAXIN) tablet 500 mg (500 mg Oral Given 05/17/24 1611)    Reassessment after intervention:  symptoms improved.    Clinical Laboratory Tests Ordered, included CBC without leukocytosis or  anemia.  CMP shows normal creatinine and electrolytes.  LFTs normal.  EtOH 21.  Medical Decision Making: Summary:  Patient presents the emergency department with opiate withdrawal symptoms.  He is 3 days off of his methadone and having opiate withdrawal  effects.  No benzo or alcohol use.  No seizures.  We discussed that we do not carry methadone at this facility as we are at a freestanding emergency department.  Could consider alternatives but patient mentions wanting fentanyl  or some other acute acting opiate.  Discussed that we would not be providing him with acute acting opiate medications.  Patient prefers to be discharged where he can go to a larger emergency department that may carry methadone on site rather than exploring other options.  I did send some medications to the pharmacy to help with symptoms and encouraged him to follow closely with his methadone clinic.  He is requesting discharge at this time.   Patient's presentation is most consistent with exacerbation of chronic illness.   Disposition: discharge  ____________________________________________  FINAL CLINICAL IMPRESSION(S) / ED DIAGNOSES  Final diagnoses:  Opiate withdrawal (HCC)    NEW OUTPATIENT MEDICATIONS STARTED DURING THIS VISIT:  New Prescriptions   LOPERAMIDE (IMODIUM) 2 MG CAPSULE    Take 1 capsule (2 mg total) by mouth 4 (four) times daily as needed for diarrhea or loose stools.   METHOCARBAMOL (ROBAXIN) 500 MG TABLET    Take 1 tablet (500 mg total) by mouth every 8 (eight) hours as needed.   ONDANSETRON  (ZOFRAN -ODT) 4 MG DISINTEGRATING TABLET    Take 1 tablet (4 mg total) by mouth every 8 (eight) hours as needed.    Note:  This document was prepared using Dragon voice recognition software and may include unintentional dictation errors.  Fonda Law, MD, [facility] Emergency Medicine    Ezell Poke, Fonda MATSU, MD 05/17/24 [phone]

## 2024-05-17 NOTE — Discharge Instructions (Addendum)
 I have called in's medications to help with your withdrawal symptoms.  Please report to your methadone clinic for further management. Return with any new or suddenly worsening symptoms.

## 2024-05-17 NOTE — ED Notes (Addendum)
 Patient left [facility] by day shift, unable to preform discharge condition or pain assessment.

## 2024-05-26 ENCOUNTER — Emergency Department (HOSPITAL_COMMUNITY)
Admission: EM | Admit: 2024-05-26 | Discharge: 2024-05-26 | Discharge status: Against Medical Advice | Payer: MEDICAID | Attending: Emergency Medicine | Admitting: Emergency Medicine

## 2024-05-26 ENCOUNTER — Encounter (HOSPITAL_COMMUNITY): Payer: Self-pay | Admitting: Emergency Medicine

## 2024-05-26 ENCOUNTER — Other Ambulatory Visit: Payer: Self-pay

## 2024-05-26 DIAGNOSIS — F1010 Alcohol abuse, uncomplicated: Secondary | ICD-10-CM | POA: Diagnosis not present

## 2024-05-26 DIAGNOSIS — Z5329 Procedure and treatment not carried out because of patient's decision for other reasons: Secondary | ICD-10-CM | POA: Insufficient documentation

## 2024-05-26 DIAGNOSIS — F1120 Opioid dependence, uncomplicated: Secondary | ICD-10-CM | POA: Diagnosis present

## 2024-05-26 DIAGNOSIS — F1910 Other psychoactive substance abuse, uncomplicated: Secondary | ICD-10-CM

## 2024-05-26 DIAGNOSIS — F1210 Cannabis abuse, uncomplicated: Secondary | ICD-10-CM | POA: Diagnosis not present

## 2024-05-26 DIAGNOSIS — F1410 Cocaine abuse, uncomplicated: Secondary | ICD-10-CM | POA: Diagnosis not present

## 2024-05-26 DIAGNOSIS — F1920 Other psychoactive substance dependence, uncomplicated: Secondary | ICD-10-CM | POA: Insufficient documentation

## 2024-05-26 DIAGNOSIS — F19921 Other psychoactive substance use, unspecified with intoxication with delirium: Secondary | ICD-10-CM | POA: Diagnosis present

## 2024-05-26 DIAGNOSIS — F1510 Other stimulant abuse, uncomplicated: Secondary | ICD-10-CM | POA: Diagnosis not present

## 2024-05-26 LAB — CBC WITH DIFFERENTIAL/PLATELET
Abs Immature Granulocytes: 0.01 K/uL (ref 0.00–0.07)
Basophils Absolute: 0.1 K/uL (ref 0.0–0.1)
Basophils Relative: 1 %
Eosinophils Absolute: 0.2 K/uL (ref 0.0–0.5)
Eosinophils Relative: 4 %
HCT: 38.7 % — ABNORMAL LOW (ref 39.0–52.0)
Hemoglobin: 13.4 g/dL (ref 13.0–17.0)
Immature Granulocytes: 0 %
Lymphocytes Relative: 51 %
Lymphs Abs: 2.3 K/uL (ref 0.7–4.0)
MCH: 30.2 pg (ref 26.0–34.0)
MCHC: 34.6 g/dL (ref 30.0–36.0)
MCV: 87.2 fL (ref 80.0–100.0)
Monocytes Absolute: 0.6 K/uL (ref 0.1–1.0)
Monocytes Relative: 14 %
Neutro Abs: 1.4 K/uL — ABNORMAL LOW (ref 1.7–7.7)
Neutrophils Relative %: 30 %
Platelets: 241 K/uL (ref 150–400)
RBC: 4.44 MIL/uL (ref 4.22–5.81)
RDW: 13.3 % (ref 11.5–15.5)
WBC: 4.6 K/uL (ref 4.0–10.5)
nRBC: 0 % (ref 0.0–0.2)

## 2024-05-26 LAB — COMPREHENSIVE METABOLIC PANEL WITH GFR
ALT: 16 U/L (ref 0–44)
AST: 27 U/L (ref 15–41)
Albumin: 3.5 g/dL (ref 3.5–5.0)
Alkaline Phosphatase: 77 U/L (ref 38–126)
Anion gap: 9 (ref 5–15)
BUN: 10 mg/dL (ref 6–20)
CO2: 23 mmol/L (ref 22–32)
Calcium: 8.6 mg/dL — ABNORMAL LOW (ref 8.9–10.3)
Chloride: 108 mmol/L (ref 98–111)
Creatinine, Ser: 1.01 mg/dL (ref 0.61–1.24)
GFR, Estimated: >60 mL/min (ref >60)
Glucose, Bld: 81 mg/dL (ref 70–99)
Potassium: 4.4 mmol/L (ref 3.5–5.1)
Sodium: 140 mmol/L (ref 135–145)
Total Bilirubin: 0.5 mg/dL (ref 0.0–1.2)
Total Protein: 6.8 g/dL (ref 6.5–8.1)

## 2024-05-26 LAB — URINALYSIS, ROUTINE W REFLEX MICROSCOPIC
Bilirubin Urine: NEGATIVE
Glucose, UA: NEGATIVE mg/dL
Hgb urine dipstick: NEGATIVE
Ketones, ur: NEGATIVE mg/dL
Leukocytes,Ua: NEGATIVE
Nitrite: NEGATIVE
Protein, ur: NEGATIVE mg/dL
Specific Gravity, Urine: 1.02 (ref 1.005–1.030)
pH: 5 (ref 5.0–8.0)

## 2024-05-26 LAB — RAPID URINE DRUG SCREEN, HOSP PERFORMED
Amphetamines: POSITIVE — AB
Barbiturates: NOT DETECTED
Benzodiazepines: POSITIVE — AB
Cocaine: POSITIVE — AB
Opiates: NOT DETECTED
Tetrahydrocannabinol: POSITIVE — AB

## 2024-05-26 LAB — ETHANOL: Alcohol, Ethyl (B): <15 mg/dL (ref <15)

## 2024-05-26 LAB — ACETAMINOPHEN LEVEL: Acetaminophen (Tylenol), Serum: <10 ug/mL — ABNORMAL LOW (ref 10–30)

## 2024-05-26 LAB — SALICYLATE LEVEL: Salicylate Lvl: <7 mg/dL — ABNORMAL LOW (ref 7.0–30.0)
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
## 2024-05-26 NOTE — ED Notes (Signed)
 Pt left AMA, stated  I I have to get my affairs in orders before I can stay, I have a dog at home, and no one else I trust to call. Pt also mentioned that he will be going in-patient next week. Educated pt on the plan and safety purposes for him to stay as well as instructing him that staying would align with his goals of getting Clean. Pt insisted on leaving, I advised that he was going against medical advice, but he could always come back. MD made aware of situation.
